# Patient Record
Sex: Female | Born: 1995 | Race: Black or African American | Hispanic: No | Marital: Single | Smoking: Never smoker
Health system: Southern US, Community
[De-identification: ages and names within clinical notes are randomized; demographics above are authoritative.]

## PROBLEM LIST (undated history)

## (undated) DIAGNOSIS — J45909 Unspecified asthma, uncomplicated: Secondary | ICD-10-CM

## (undated) DIAGNOSIS — R51 Headache: Secondary | ICD-10-CM

## (undated) DIAGNOSIS — Z8719 Personal history of other diseases of the digestive system: Secondary | ICD-10-CM

## (undated) HISTORY — DX: Headache: R51

## (undated) HISTORY — PX: OTHER SURGICAL HISTORY: SHX169

## (undated) HISTORY — PX: COLONOSCOPY: SHX174

## (undated) HISTORY — DX: Unspecified asthma, uncomplicated: J45.909

---
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
## 2015-02-18 ENCOUNTER — Encounter: Payer: Self-pay | Admitting: Family

## 2015-03-02 ENCOUNTER — Emergency Department (HOSPITAL_COMMUNITY)
Admission: EM | Admit: 2015-03-02 | Discharge: 2015-03-02 | Disposition: A | Discharge status: Home / Self Care | Payer: BLUE CROSS/BLUE SHIELD | Source: Home / Self Care | Attending: Emergency Medicine | Admitting: Emergency Medicine

## 2015-03-02 ENCOUNTER — Encounter (HOSPITAL_COMMUNITY): Payer: Self-pay | Admitting: *Deleted

## 2015-03-02 ENCOUNTER — Telehealth: Payer: Self-pay | Admitting: *Deleted

## 2015-03-02 DIAGNOSIS — G43001 Migraine without aura, not intractable, with status migrainosus: Secondary | ICD-10-CM

## 2015-03-02 DIAGNOSIS — J0100 Acute maxillary sinusitis, unspecified: Secondary | ICD-10-CM | POA: Diagnosis not present

## 2015-03-02 MED ORDER — AMOXICILLIN 500 MG PO CAPS: 500.0000 mg | ORAL_CAPSULE | Freq: Two times a day (BID) | ORAL | Status: DC

## 2015-03-02 NOTE — ED Provider Notes (Signed)
CSN: [id number]     Arrival date & time 03/02/15  1643 History   First MD Initiated Contact with Patient 03/02/15 1744     Chief Complaint  Patient presents with  . Headache   (Consider location/radiation/quality/duration/timing/severity/associated sxs/prior Treatment) HPI Comments: Kenniya is a 20 yo who is followed by Neurology for Migraine and tension headaches. She presents today with acute onset of "migraine" she thinks brought on by a sinus infection. Patient describes nasal congestion and facial pain x 5 days. No fevers but "feels chilled". No associated sore throat or cough. Her headaches feels like a typical migraine with photophobia and along the lateral regions of her head. See ROS.   Patient is a 20 y.o. female presenting with headaches. The history is provided by the patient.  Headache Associated symptoms: congestion and sinus pressure   Associated symptoms: no cough, no dizziness, no ear pain, no fatigue, no fever, no numbness, no sore throat and no weakness     Past Medical History  Diagnosis Date  . [id number](478.2)    Past Surgical History  Procedure Laterality Date  . Tympanostomy tube placement  1999  . Adenoids   2004    Removed  . Ear tubes removed  2002 and 2009   History reviewed. No pertinent family history. Social History  Substance Use Topics  . Smoking status: Never Smoker   . Smokeless tobacco: Never Used  . Alcohol Use: No   OB History    No data available     Review of Systems  Constitutional: Positive for chills. Negative for fever and fatigue.  HENT: Positive for congestion and sinus pressure. Negative for ear pain, sore throat and tinnitus.   Eyes: Negative.   Respiratory: Negative for cough.   Musculoskeletal: Negative.   Neurological: Positive for headaches. Negative for dizziness, weakness and numbness.  Psychiatric/Behavioral: Negative.     Allergies  Zithromax  Home Medications   Prior to Admission medications   Medication Sig  Start Date End Date Taking? Authorizing Provider  amoxicillin (AMOXIL) 500 MG capsule Take 1 capsule (500 mg total) by mouth 2 (two) times daily. 03/02/15   Riki Sheer, PA-C  atenolol (TENORMIN) 25 MG tablet Take 1/2 tablet at bedtime 11/10/14   Elveria Rising, NP  CAMILA 0.35 MG tablet Take 1 tablet by mouth daily. 01/12/14   Historical Provider, MD  ibuprofen (ADVIL,MOTRIN) 200 MG tablet Take 2 tablets at onset of migraine.    Historical Provider, MD  ondansetron (ZOFRAN) 4 MG tablet Take 1 tablet at onset of nausea. May repeat in 6-8 hours if needed 11/10/14   Elveria Rising, NP  tiZANidine (ZANAFLEX) 4 MG tablet Take 1 tablet at onset of severe headache. May repeat in 6-8 hours if needed 11/10/14   Elveria Rising, NP   Meds Ordered and Administered this Visit  Medications - No data to display  BP 119/79 mmHg  Pulse 88  Temp(Src) 98.4 F (36.9 C) (Oral)  Resp 16  SpO2 100% No data found.   Physical Exam  Constitutional: She is oriented to person, place, and time. She appears well-developed and well-nourished. No distress.  Patient sitting in a lighted exam room, in no acute distress. She is smiling and appears alert  HENT:  Head: Normocephalic and atraumatic.  Right Ear: External ear normal.  Left Ear: External ear normal.  Mouth/Throat: No oropharyngeal exudate.  Swelling to inferior turbinates with erythema and yellow drainage. Pressure to palpation of the maxillary sinus  Eyes: EOM are normal.  Pupils are equal, round, and reactive to light. Right eye exhibits no discharge. Left eye exhibits no discharge.  Neck: Normal range of motion.  Pulmonary/Chest: Effort normal.  Lymphadenopathy:    She has no cervical adenopathy.  Neurological: She is alert and oriented to person, place, and time. No cranial nerve deficit. She exhibits normal muscle tone. Coordination normal.  Skin: Skin is warm and dry. No rash noted. She is not diaphoretic.  Psychiatric: Her behavior is  normal.  Nursing note and vitals reviewed.   ED Course  Procedures (including critical care time)  Labs Review Labs Reviewed - No data to display  Imaging Review No results found.   Visual Acuity Review  Right Eye Distance:   Left Eye Distance:   Bilateral Distance:    Right Eye Near:   Left Eye Near:    Bilateral Near:         MDM   1. Migraine without aura and with status migrainosus, not intractable   2. Acute maxillary sinusitis, recurrence not specified    1. Patient non-toxic and no acute distress. No neuro deficit noted and history of migraines. Offered treatment protocol of injections, but she would like to use her medications from home. Agree. She is instructed to f/u with Neurologist.  2. Treat with Amox, hydration and OTC sinus meds if needed. May continue with use of Advil 600-800mg  tid. F/U if needed. School note given for today and tomorrow.     Riki Sheer, PA-C 03/02/15 1825

## 2015-03-02 NOTE — Discharge Instructions (Signed)
Migraine Headache A migraine headache is an intense, throbbing pain on one or both sides of your head. A migraine can last for 30 minutes to several hours. CAUSES  The exact cause of a migraine headache is not always known. However, a migraine may be caused when nerves in the brain become irritated and release chemicals that cause inflammation. This causes pain. Certain things may also trigger migraines, such as:  Alcohol.  Smoking.  Stress.  Menstruation.  Aged cheeses.  Foods or drinks that contain nitrates, glutamate, aspartame, or tyramine.  Lack of sleep.  Chocolate.  Caffeine.  Hunger.  Physical exertion.  Fatigue.  Medicines used to treat chest pain (nitroglycerine), birth control pills, estrogen, and some blood pressure medicines. SIGNS AND SYMPTOMS  Pain on one or both sides of your head.  Pulsating or throbbing pain.  Severe pain that prevents daily activities.  Pain that is aggravated by any physical activity.  Nausea, vomiting, or both.  Dizziness.  Pain with exposure to bright lights, loud noises, or activity.  General sensitivity to bright lights, loud noises, or smells. Before you get a migraine, you may get warning signs that a migraine is coming (aura). An aura may include:  Seeing flashing lights.  Seeing bright spots, halos, or zigzag lines.  Having tunnel vision or blurred vision.  Having feelings of numbness or tingling.  Having trouble talking.  Having muscle weakness. DIAGNOSIS  A migraine headache is often diagnosed based on:  Symptoms.  Physical exam.  A CT scan or MRI of your head. These imaging tests cannot diagnose migraines, but they can help rule out other causes of headaches. TREATMENT Medicines may be given for pain and nausea. Medicines can also be given to help prevent recurrent migraines.  HOME CARE INSTRUCTIONS  Only take over-the-counter or prescription medicines for pain or discomfort as directed by your  health care provider. The use of long-term narcotics is not recommended.  Lie down in a dark, quiet room when you have a migraine.  Keep a journal to find out what may trigger your migraine headaches. For example, write down:  What you eat and drink.  How much sleep you get.  Any change to your diet or medicines.  Limit alcohol consumption.  Quit smoking if you smoke.  Get 7-9 hours of sleep, or as recommended by your health care provider.  Limit stress.  Keep lights dim if bright lights bother you and make your migraines worse. SEEK IMMEDIATE MEDICAL CARE IF:   Your migraine becomes severe.  You have a fever.  You have a stiff neck.  You have vision loss.  You have muscular weakness or loss of muscle control.  You start losing your balance or have trouble walking.  You feel faint or pass out.  You have severe symptoms that are different from your first symptoms. MAKE SURE YOU:   Understand these instructions.  Will watch your condition.  Will get help right away if you are not doing well or get worse.   This information is not intended to replace advice given to you by your health care provider. Make sure you discuss any questions you have with your health care provider.   Document Released: 01/23/2005 Document Revised: 02/13/2014 Document Reviewed: 09/30/2012 Elsevier Interactive Patient Education 2016 Elsevier Inc.  Sinusitis, Adult Sinusitis is redness, soreness, and puffiness (inflammation) of the air pockets in the bones of your face (sinuses). The redness, soreness, and puffiness can cause air and mucus to get trapped in  your sinuses. This can allow germs to grow and cause an infection.  HOME CARE   Drink enough fluids to keep your pee (urine) clear or pale yellow.  Use a humidifier in your home.  Run a hot shower to create steam in the bathroom. Sit in the bathroom with the door closed. Breathe in the steam 3-4 times a day.  Put a warm, moist  washcloth on your face 3-4 times a day, or as told by your doctor.  Use salt water sprays (saline sprays) to wet the thick fluid in your nose. This can help the sinuses drain.  Only take medicine as told by your doctor. GET HELP RIGHT AWAY IF:   Your pain gets worse.  You have very bad headaches.  You are sick to your stomach (nauseous).  You throw up (vomit).  You are very sleepy (drowsy) all the time.  Your face is puffy (swollen).  Your vision changes.  You have a stiff neck.  You have trouble breathing. MAKE SURE YOU:   Understand these instructions.  Will watch your condition.  Will get help right away if you are not doing well or get worse.   This information is not intended to replace advice given to you by your health care provider. Make sure you discuss any questions you have with your health care provider.   You appear to have a sinus infection and a Migraine. Will treat sinusitis with Amoxicillin, take all of it. Use your medication as per your Neurologist since you did not want acute treatment medications today. Hope you feel better.    Document Released: 07/12/2007 Document Revised: 02/13/2014 Document Reviewed: 08/29/2011 Elsevier Interactive Patient Education [employer]

## 2015-03-02 NOTE — Telephone Encounter (Signed)
Patient called and states that she is sick with a sinus infection and that is triggering migraines. She states that she would like to talk to Inetta Fermo to see if there is anything that could help with the migraine because it is interfering with school and work. Patient can be reached at [phone]

## 2015-03-02 NOTE — ED Notes (Signed)
Pt  Reports  Symptoms  Of  Headache     With  Photophobia   And     Sinus  Congestion           pt  Reports    A  History    Of migraines  In  Past       Pt  Reports  Symptoms     Not  releived  By otc meds

## 2015-03-02 NOTE — Telephone Encounter (Signed)
I talked with Anna Fitzpatrick. She said that she been sick since Friday with what she believes is a sinus infection because she has severe congestion and facial pain. She has been taking Mucinex without much relief. She said that she has been having migraine headache pain for the last day or so and that Advil and Tizanidine have not helped. I explained that it is common for people to have significant headache with sinus congestion. I told her that there was no medication that I could call in that would likely break the headache, and that she should see her PCP or go to Urgent Care for her illness, as she says that the congestion and pain in her face is worsening instead of improving. She agreed with his plan. TG

## 2015-03-02 NOTE — Telephone Encounter (Signed)
I reviewed your note and agree with this recommendation.

## 2015-03-10 ENCOUNTER — Telehealth: Payer: Self-pay | Admitting: Family

## 2015-03-10 NOTE — Telephone Encounter (Signed)
Anna Fitzpatrick left a message saying that a form was going to be faxed from the Student Disability Services department at [address] [facility] and that she needed that to be completed because of how her migraines were affecting her at school. I left a message for Anna Fitzpatrick and asked her to call back. I received the form from her school, and called her about it. Anna Fitzpatrick said that since she was last seen in October, that she was having frequent migraines and that they were causing problems for her at school. She asked for the form to be completed so that she could get some accommodations at school. I told her that if her migraines were that frequent and severe, that she needed to come in for an appointment. She agreed and accepted an appointment for Friday February 3 at 2:15PM, arrival time 2:00PM. TG

## 2015-03-10 NOTE — Telephone Encounter (Signed)
I reviewed your note and agree with this plan. 

## 2015-03-12 ENCOUNTER — Encounter: Payer: Self-pay | Admitting: Family

## 2015-03-12 ENCOUNTER — Ambulatory Visit (INDEPENDENT_AMBULATORY_CARE_PROVIDER_SITE_OTHER): Payer: BLUE CROSS/BLUE SHIELD | Admitting: Family

## 2015-03-12 VITALS — BP 96/64 | HR 82 | Ht 61.0 in | Wt 132.2 lb

## 2015-03-12 DIAGNOSIS — G43001 Migraine without aura, not intractable, with status migrainosus: Secondary | ICD-10-CM

## 2015-03-12 DIAGNOSIS — G44219 Episodic tension-type headache, not intractable: Secondary | ICD-10-CM

## 2015-03-12 DIAGNOSIS — Z8679 Personal history of other diseases of the circulatory system: Secondary | ICD-10-CM

## 2015-03-12 MED ORDER — DIVALPROEX SODIUM ER 250 MG PO TB24
ORAL_TABLET | ORAL | Status: DC
Start: 2015-03-12 — End: 2015-05-14

## 2015-03-12 NOTE — Progress Notes (Signed)
Patient: Anna Fitzpatrick MRN: [id number] Sex: female DOB: 01-Dec-1995  Provider: Elveria Rising, NP Location of Care: [facility]  Note type: Urgent return visit  History of Present Illness: Referral Source: Cliffton Asters, PA History from: patient and [facility] chart Chief Complaint: Migraine/Headaches  Anna Fitzpatrick is a 20 y.o. girl with history of headaches that began in 2010. 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 She was last seen November 10, 2014. Anna Fitzpatrick took Topiramate for migraine prevention in 2015 but stopped it on her own because she felt that it was not particularly beneficial. She fortunately had improvement in her headaches until September 2016, and when Anna saw her in October, recommended Atenolol for migraine prevention. She stopped it because she felt that it was worsening her asthma. Anna Fitzpatrick said that she continued to have frequent headaches but that it was manageable until about a month or so ago when the headaches became daily. Then she had a sinus infection in January 2017 along with a week long migraine that was ultimately treated in the ER. She has felt better since being on an antibiotic for the sinus infection, and although the severe migraine resolved, says that she continues to have frequent migraines that interfere with her functioning at school.   Anna Fitzpatrick complains of holocephalic pain with intolerance to light almost every day. Some days are also accompanied by nausea and feeling week. She says that Ibuprofen does not give her much relief and she is interested in trying another migraine preventative. Anna Fitzpatrick admits that she has been experiencing some stress from college course work. She pushes herself to maintain a high GPA. Anna Fitzpatrick has missed several days of school due to migraine and has applied for Student Disability Services for accommodations. She said that a professor has challenged her about her headaches, saying that he  is a migraine sufferer and that migraines only last one day.   Anna Fitzpatrick says that she has some headaches that may be tension headaches because the pain is less severe and there are no other symptoms, but she says that these headaches are infrequent.   Anna Fitzpatrick denies skipping meals and says that she is getting more sleep since reducing her work study job hours. She says that she drinks water, lemonade and juices, and rarely drinks soft drinks. She says that she does not drink tea or coffee.   Anna Fitzpatrick has no other health concerns today other than previously mentioned.  Review of Systems: Please see the HPI for neurologic and other pertinent review of systems. Otherwise, the following systems are noncontributory including constitutional, eyes, ears, nose and throat, cardiovascular, respiratory, gastrointestinal, genitourinary, musculoskeletal, skin, endocrine, hematologic/lymph, allergic/immunologic and psychiatric.   Past Medical History  Diagnosis Date  . Headache(784.0)    Hospitalizations: No., Head Injury: No., Nervous System Infections: No., Immunizations up to date: Yes.   Past Medical History Comments: See history  Surgical History Past Surgical History  Procedure Laterality Date  . Tympanostomy tube placement  1999  . Adenoids   2004    Removed  . Ear tubes removed  2002 and 2009    Family History family history is not on file. Family History is otherwise negative for migraines, seizures, cognitive impairment, blindness, deafness, birth defects, chromosomal disorder, autism.  Social History Social History   Social History  . Marital Status: Single    Spouse Name: N/A  . Number of Children: N/A  . Years of Education: N/A   Social History Main  Topics  . Smoking status: Never Smoker   . Smokeless tobacco: Never Used  . Alcohol Use: No  . Drug Use: No  . Sexual Activity: No   Other Topics Concern  . None   Social History Narrative   Anna Fitzpatrick is in college at [employer]. She is doing well in school and is doing a work study program with [employer]. Ciena lives with her mom and also stays in her dorm. Harpreet enjoys reading and working with kids.    Allergies Allergies  Allergen Reactions  . Zithromax [Azithromycin] Hives and Rash    Physical Exam BP 96/64 mmHg  Pulse 82  Ht  (1.549 m)  Wt 132 lb 3.2 oz (59.966 kg)  BMI 24.99 kg/m2 General: well developed, well nourished female, seated on exam table, in no evident distress  Head: normocephalic and atraumatic. Oropharynx benign.  Ears, Nose and Throat: Oropharynx benign.  Neck: supple with no carotid or supraclavicular bruits.  Respiratory: auscultation clear  Cardiovascular: regular rate and rhythm, no murmurs.  Musculoskeletal: no obvious deformities or scoliosis  Skin: no rashes or neurocutaneous lesions   Neurologic Exam  Mental Status: Awake and fully alert. Attention span, concentration, and fund of knowledge appropriate for age. Speech fluent without dysarthria. Able to follow commands and participate in examination.  Cranial Nerves: Fundoscopic exam reveal sharp disc margins. Pupils equal, briskly reactive to light. Extraocular movements full without nystagmus. Visual fields full to confrontation. Hearing intact and symmetric to whisper. Facial sensation intact. Face, tongue, palate move normally and symmetrically. Neck flexion and extension normal.  Motor: Normal bulk and tone. Normal strength in all tested extremity muscles  Sensory: Intact to touch and temperature in all extremities.  Coordination: Rapid movements: finger and toe tapping normal and symmetric bilaterally. Finger-to-nose and heel-to-shin intact bilaterally. Able to balance on either foot. Romberg negative.  Gait and Station: Arises from chair without difficulty. Stance is normal. Gait demonstrates normal stride length and balance. Able to heel, toe, and tandem walk without  difficulty.  Reflexes: diminished and symmetric. Toes downgoing. No clonus.    Impression 1. Migraine without aura, without status migrainosus, not intractable 2. Episodic tension headaches, not intractable 3. Migraine variant - ice pick headache 4. History of hypotension  Recommendations for plan of care The patient's previous [facility] records were reviewed. Albirtha has neither had nor required imaging or lab studies since the last visit. She is a 20 year old young woman with history of migraine and tension headaches. She had experienced an increase in migraines and wants to try a migraine preventative again. She used to take Topiramate but did not feel that it was helpful. She then tried Atenolol but felt that it made her asthma worse. Anna talked with Anna Fitzpatrick as well as her mother by phone about the headaches. Anna explained that it is common for migraines to occur when a person has an illness or infection. Anna talked about the options for migraine prevention and stressed that these medications are ineffective for tension headaches. After discussion, Anna Fitzpatrick elected to try Depakote ER. Anna reviewed the medication with Anna Fitzpatrick and her mother by phone, and stressed that the medication should not be taken by pregnant woman. Anna Fitzpatrick says that she is not sexually active and Anna told her that if this changes, that she must use effective birth control while on Depakote.   Anna Fitzpatrick, which was lower than expected today at 96/64. She has  had hypotension before, and says that when migraines are severe that she sometimes feels as if she may faint. Anna explained the relationship between hydration and her blood Fitzpatrick. Anna stressed to Anna Fitzpatrick that she needs to be drinking at least 60-80 oz of water per day and to limit beverages with caffeine. Anna told her that if she felt weak or faint, that she should sit or lie down, to help prevent her from fainting.   Anna reminded Anna Fitzpatrick to avoid skipping  meals and to get least 8 or 9 oz of water per day as these lifestyle measures can with minimizing headaches. We also talked at some length about stress management as she pushes herself to succeed. Anna will write a letter to her school about the prolonged migraines and supporting her decision to reduce her work study hours this semester.   Anna asked Anna Fitzpatrick to call me in 2 weeks to report on her migraines. will see her back in follow up in 6 weeks or sooner if needed. Anna Fitzpatrick agreed with these plans.   The medication list was reviewed and reconciled.  Anna reviewed changes that were made in the prescribed medications today.  A complete medication list was provided to the patient.   Dr. Sharene Skeans was consulted regarding this patient.   Total time spent with the patient was 40 minutes, of which 50% or more was spent in counseling and coordination of care.

## 2015-03-12 NOTE — Patient Instructions (Signed)
For your migraines, we will try a medication called Divalproex (generic for Depakote).  Depakote (Divalproex) is a seizure medication that also has an FDA approval for prevention of migraine headaches. Potential side effects of this medication include weight gain, tremor or stomach upset, but this does not tend to occur on the extended release version that you will be taking. If you have any other side effect, such as unusual sleepiness, let me know. This medication should not be taken by pregnant women as it may cause facial birth defects in a developing fetus. You must use dependable birth control methods while taking Depakote.   To take the Divalproex - take 1 tablet at bedtime. Call me or send me a MyChart message in 2 weeks to let me know how your headaches have responded to the medication. We may need to increase the dose if the migraines are still frequent.   Divalproex (and other migraine medications) are not effective on tension headaches, only migraines.   Your blood pressure is somewhat lower than expected today at 96/64. Remember that you need to be drinking at least 60-80 oz of water per day to help your blood pressure be in a normal range and to help to reduce headaches. If you drink soft drinks, tea or coffee beverages that contain caffeine, that can cause mild dehydration as caffeine removes fluid from your body and can also cause headaches from a rebound effect. Try to drink more water than beverages with caffeine.   Also remember that you should avoid skipping meals, and should be getting 8 or 9 hours of sleep each night. These things also help with reducing headache frequency.   I will write a letter to your school about your prolonged migraines and about your need to reduce the work-study hours. I will let you know when those are ready.   Consider signing up for MyChart - the electronic medical record portal that allows you to see your medical information and gives [place] a quick, secure  way to communicate. There will be sign up information at the end of this paper.   Please plan on returning for follow up in 6 weeks or sooner if needed. Be sure to contact me in 2 weeks to let me know how you are doing.

## 2015-03-16 ENCOUNTER — Encounter: Payer: Self-pay | Admitting: Family

## 2015-03-17 ENCOUNTER — Encounter: Payer: Self-pay | Admitting: Family

## 2015-03-17 ENCOUNTER — Telehealth: Payer: Self-pay | Admitting: Family

## 2015-03-17 DIAGNOSIS — R404 Transient alteration of awareness: Secondary | ICD-10-CM

## 2015-03-17 NOTE — Telephone Encounter (Signed)
Noted thank you
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
## 2015-03-23 ENCOUNTER — Encounter: Payer: Self-pay | Admitting: Family

## 2015-03-26 ENCOUNTER — Ambulatory Visit (HOSPITAL_COMMUNITY): Payer: BLUE CROSS/BLUE SHIELD

## 2015-04-19 ENCOUNTER — Ambulatory Visit: Payer: BLUE CROSS/BLUE SHIELD | Admitting: Family

## 2015-04-23 ENCOUNTER — Ambulatory Visit: Payer: BLUE CROSS/BLUE SHIELD | Admitting: Family

## 2015-05-12 ENCOUNTER — Encounter: Payer: Self-pay | Admitting: Family

## 2015-05-12 NOTE — Telephone Encounter (Signed)
I talked with [name] by phone. She is having increased frequency of migraines. She says that they tend to occur when she is doing school work or watching TV. She denied skipping meals but said that she frequently has upset stomach and vomits. She could not decide if vomiting happened in conjunction with migraines or if it happened independently. [name] said that her migraines usually start with a stomach ache, then the migraine pain begins. She said that she has been having a migraine almost every day in the last couple of weeks that requires sleep to resolve. Because of the complicated nature of her complaints, I asked [name] to come in for an appointment and she accepted an appointment on Friday morning April 7th. I will consult with Dr Sharene SkeansHickling when [name] is in the office. TG

## 2015-05-14 ENCOUNTER — Ambulatory Visit (INDEPENDENT_AMBULATORY_CARE_PROVIDER_SITE_OTHER): Payer: BLUE CROSS/BLUE SHIELD | Admitting: Family

## 2015-05-14 ENCOUNTER — Encounter: Payer: Self-pay | Admitting: Family

## 2015-05-14 VITALS — BP 88/66 | HR 84 | Ht 62.5 in | Wt 136.4 lb

## 2015-05-14 DIAGNOSIS — G44219 Episodic tension-type headache, not intractable: Secondary | ICD-10-CM | POA: Diagnosis not present

## 2015-05-14 DIAGNOSIS — Z8679 Personal history of other diseases of the circulatory system: Secondary | ICD-10-CM | POA: Diagnosis not present

## 2015-05-14 DIAGNOSIS — G43001 Migraine without aura, not intractable, with status migrainosus: Secondary | ICD-10-CM | POA: Diagnosis not present

## 2015-05-14 DIAGNOSIS — G43809 Other migraine, not intractable, without status migrainosus: Secondary | ICD-10-CM | POA: Diagnosis not present

## 2015-05-14 MED ORDER — DIVALPROEX SODIUM ER 250 MG PO TB24: ORAL_TABLET | ORAL | Status: DC

## 2015-05-14 MED ORDER — ONDANSETRON 4 MG PO TBDP: ORAL_TABLET | ORAL | Status: DC

## 2015-05-14 MED ORDER — ASPIRIN-ACETAMINOPHEN-CAFFEINE 250-250-65 MG PO TABS: ORAL_TABLET | ORAL | Status: DC

## 2015-05-14 NOTE — Patient Instructions (Signed)
Thank you for coming in today. Instructions for you until your next appointment are as follows: 1. We will increase the Divalproex ER 250mg  to 2 tablets at bedtime 2. For headaches during the day, take Excedrin Migraine - 2 tablets at the onset of the headache. You can repeat it in 4-6 hours if needed.  3. For nausea, take Ondansetron 4mg . I have changed it to a "melt in your mouth" tablet, so take that first and in about 10 minutes, take the Excedrin Migraine.  4. Only take Tizanidine 4mg  at nighttime for severe headaches that keep you from being able to sleep.  5. Work on exercising at least 20 minutes every day - walking is a good exercise to do.  6. Remember to avoid skipping meals and to stay on a regular sleep schedule. 7. Keep a headache diary and send me the chart each month. I will call you or send you a MyChart message each time I receive a diary. 8. Your blood pressure is low today at 88/66. You need to be drinking at least 60 oz of water each day. I would also recommend about 20 oz of a sports drink such as sugar free Gatorade each day.  Please plan to return for follow up in 2 months or sooner if needed.

## 2015-05-14 NOTE — Progress Notes (Signed)
Patient: Anna Fitzpatrick MRN: [id number] Sex: female DOB: August 29, 1995  Provider: Elveria Rising, NP Location of Care: [facility]  Note type: Urgent return visit  History of Present Illness: History from: patient and [facility] chart Chief Complaint: migraine headaches  Anna Fitzpatrick is a 20 y.o. young woman with history of headaches that began in 2010. 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 She was last seen March 12, 2015. Anna Fitzpatrick took Topiramate for migraine prevention in 2015 but stopped it on her own because she felt that it was not particularly beneficial and because she was concerned about it making her oral contraceptive less effective. She fortunately had improvement in her headaches until September 2016, and when I saw her in October, recommended Atenolol for migraine prevention. She stopped it because she felt that it was worsening her asthma. Anna Fitzpatrick said that she continued to have frequent headaches but that it was manageable until January 2017 when the headaches became daily. She had a sinus infection in January 2017 along with a week long migraine that was ultimately treated in the ER. When she was seen in the office in March 12, 2015, Divalproex ER  was recommended for migraine prevention.    Anna Fitzpatrick contacted me via MyChart on March 17 2015 to report a painful vibration in her head along with a syncopal episode. A EEG was recommended but the out of pocket cost for Anna Fitzpatrick was going to be large and she decided not to proceed with that.    Anna Fitzpatrick contacted me 2 days ago because she has been experiencing frequent migraine headaches and that she felt that the only relief she had was when she was asleep. She has not been keeping headache diaries but describes near daily headaches of varying severity. She said that she had a daith peircing on March 19, 2069 and felt that the migraines were less frequent and less severe for about  a week afterwards, but then worsened after that. Anna Fitzpatrick reported that watching TV or doing schoolwork tended to worsen headaches. She said that she had an eye examination and no changes were needed in her prescription for contact lenses. Anna Fitzpatrick had some difficulty describing her migraines but said that she generally had pain all over her head with intolerance to light, nausea and feeling "fuzzy" and "not grounded". She said that she sometimes felt lightheaded and dizzy. Anna Fitzpatrick complains of significant nausea and says that she doesn't take Ibuprofen because it makes her feel more nauseated. She has been taking Tizanidine, and says that helps her sleep through the pain but does not give headache relief.   Anna Fitzpatrick denies skipping meals and says that she is getting at least 8 hours of sleep. She says that she drinks water, lemonade and juices, and rarely drinks soft drinks. She says that she does not drink tea or coffee. She says that she has tried to increase her fluid intake after her last visit when she learned that her blood pressure was lower than expected. Anna Fitzpatrick denies school related stress but admits that she pushes herself to do well academically. She has a work-study job in the evenings and says that she has missed time from work due to migraine.   Anna Fitzpatrick admits that she is both worried and frustrated about the migraine headaches. She wonders if there is any other testing that should be done to determine why she has migraines. Anna Fitzpatrick says that she has a migraine this morning and is lying on  the exam table with the lights off in the room. She believes this headache was triggered by poor sleep last night, saying that she awakened during the night with a migraine. Anna Fitzpatrick said that she was unable to eat this morning but drank some apple juice.  Anna Fitzpatrick has no other health concerns today other than previously mentioned.  Review of Systems: Please see the HPI for neurologic and other pertinent review of  systems. Otherwise, the following systems are noncontributory including constitutional, eyes, ears, nose and throat, cardiovascular, respiratory, gastrointestinal, genitourinary, musculoskeletal, skin, endocrine, hematologic/lymph, allergic/immunologic and psychiatric.   Past Medical History  Diagnosis Date  . Headache(784.0)    Hospitalizations: No., Head Injury: Yes.  , Nervous System Infections: No., Immunizations up to date: Yes.   Past Medical History Comments: See history  Surgical History Past Surgical History  Procedure Laterality Date  . Tympanostomy tube placement  1999  . Adenoids   2004    Removed  . Ear tubes removed  2002 and 2009    Family History family history is not on file. Family History is otherwise negative for migraines, seizures, cognitive impairment, blindness, deafness, birth defects, chromosomal disorder, autism.  Social History Social History   Social History  . Marital Status: Single    Spouse Name: N/A  . Number of Children: N/A  . Years of Education: N/A   Social History Main Topics  . Smoking status: Never Smoker   . Smokeless tobacco: Never Used  . Alcohol Use: No  . Drug Use: No  . Sexual Activity: No   Other Topics Concern  . 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 She is doing well in school and is doing a work study program with [employer]. Anna Fitzpatrick lives with her mom and also stays in her dorm. Anna Fitzpatrick enjoys reading and working with kids.    Allergies Allergies  Allergen Reactions  . Zithromax [Azithromycin] Hives and Rash    Physical Exam BP 88/66 mmHg  Pulse 84  Ht 5' 2.5" (1.588 m)  Wt 136 lb 6.4 oz (61.871 kg)  BMI 24.54 kg/m2  LMP 05/06/2015 (Exact Date) General: well developed, well nourished female, lying on exam table, shielding her eyes from light.  Head: normocephalic and atraumatic. Oropharynx benign.  Ears, Nose and Throat: Oropharynx benign.  Neck:  supple with no carotid or supraclavicular bruits.  Respiratory: auscultation clear  Cardiovascular: regular rate and rhythm, no murmurs.  Musculoskeletal: no obvious deformities or scoliosis  Skin: no rashes or neurocutaneous lesions   Neurologic Exam  Mental Status: Awake and fully alert. Attention span, concentration, and fund of knowledge appropriate for age. Speech fluent without dysarthria. Able to follow commands and participate in examination.  Cranial Nerves: Fundoscopic exam reveal sharp disc margins. Pupils equal, briskly reactive to light. Extraocular movements full without nystagmus. Visual fields full to confrontation. Hearing intact and symmetric to whisper. Facial sensation intact. Face, tongue, palate move normally and symmetrically. Neck flexion and extension normal.  Motor: Normal bulk and tone. Normal strength in all tested extremity muscles  Sensory: Intact to touch and temperature in all extremities.  Coordination: Rapid movements: finger and toe tapping normal and symmetric bilaterally. Finger-to-nose and heel-to-shin intact bilaterally. Able to balance on either foot. Romberg negative.  Gait and Station: Arises from chair without difficulty. Stance is normal. Gait demonstrates normal stride length and balance. Able to heel, toe, and tandem walk without difficulty.  Reflexes: diminished and symmetric. Toes  downgoing. No clonus.   Impression 1. Migraine without aura, without status migrainosus, not intractable 2. Episodic tension headaches, not intractable 3. Migraine variant - ice pick headache 4. History of hypotension  Recommendations for plan of care [facility]. [name] has neither had nor required imaging or lab studies since the last visit. She is a 20 year old young woman with history of migraine and tension headaches. She is taking Divalproex ER 250mg  for migraine prevention and has not experienced reduction of  migraine frequency or severity since her last visit in February. In addition, she feels that Ibuprofen makes her more nauseated and does not give relief of pain. She has tolerated Tizandine, and while it has helped her to sleep when she is in pain, it has not aborted the migraine. I talked with [name] about migraines and explained that she needed to keep a headache diary so that we can determine how many migraines and how many tension headaches she is having. I reminded her that migraine preventative medications such as Divalproex will not treat tension headaches. Since she describes most of her headaches as migraines with nausea and light intolerance, I recommended that she increase the Divalproex ER 250mg  to 2 tablets at bedtime for now. I also recommended that she try Excedrin Migraine to treat her headaches since she feels that Ibuprofen is ineffective. I changed the Ondansetron prescription to an ODT formulation because of her complaints of nausea, and instructed her to take the Ondansetron ODT first, then take the Excedrin Migraine about 10 minutes later.   I also talked with [name] about her blood pressure, which was lower than expected today at 88/66. She has had hypotension before, and says that when migraines are severe that she sometimes feels as if she may faint. I explained the relationship between hydration and her blood pressure. I stressed to [name] that she needs to be drinking at least 60-80 oz of water per day and to limit beverages with caffeine. I told her that if she felt weak or faint, that she should sit or lie down, to help prevent her from fainting.   I reminded [name] to avoid skipping meals and to get least 8 or 9 hours of sleep per day as these lifestyle measures can with minimizing headaches.  [name] says that she naps frequently during the day because of headache pain, and I encouraged her to try to avoid doing so unless the headache was intolerable. I explained to her that  excessive sleep can also trigger headaches and that it is best to stay on a regular sleep schedule. I also recommended that she begin a daily exercise program such as walking, and explained that exercise has been shown to reduce headache frequency.   Finally, I attempted to reassure [name] about her headaches, explaining that she has a normal neurological examination and a history of headaches since 2010. I told her that an MRI of the brain could be performed, but that it was likely to be normal, because of her history of headaches with a normal examination. She decided to wait on that for now.  I asked [name] to send in the April headache diary at the end of the month, to contact me by phone or MyChart if she has any questions or concerns and to return for follow up in 2 months or sooner if needed. Willetta agreed with these plans.   The medication list was reviewed and reconciled.  I reviewed changes that were  made in the prescribed medications today.  A complete medication list was provided to the patient.    Medication List       This list is accurate as of: 05/14/15 10:48 AM.  Always use your most recent med list.               aspirin-acetaminophen-caffeine 250-250-65 MG tablet  Commonly known as:  EXCEDRIN MIGRAINE  Take 2 tablets at onset of migraine. May repeat in 4-6 hours if needed.     CAMILA 0.35 MG tablet  Generic drug:  norethindrone  Take 1 tablet by mouth daily. Reported on 03/12/2015     divalproex 250 MG 24 hr tablet  Commonly known as:  DEPAKOTE ER  Take 2 tablets at bedtime daily     ibuprofen 200 MG tablet  Commonly known as:  ADVIL,MOTRIN  Take 2 tablets at onset of migraine.     ondansetron 4 MG disintegrating tablet  Commonly known as:  ZOFRAN ODT  Take 1 under the tongue at onset of nausea. May repeat in 4-6 hours if needed.     tiZANidine 4 MG tablet  Commonly known as:  ZANAFLEX  Take 1 tablet at onset of severe headache. May repeat in 6-8 hours if  needed       Dr. Sharene Skeans was consulted regarding the patient.   Total time spent with the patient was 40 minutes, of which 50% or more was spent in counseling and coordination of care.   Elveria Rising

## 2015-06-01 ENCOUNTER — Encounter: Payer: Self-pay | Admitting: Family

## 2015-06-01 NOTE — Telephone Encounter (Signed)
I reviewed your note and agree with your recommendations and plan.

## 2015-06-01 NOTE — Telephone Encounter (Signed)
I called Anna Fitzpatrick as she requested. She said that she developed a migraine on the weekend and that the pain was severe. She said that at one point she felt vibration in her head from the pain. She took Tizanidine and Excedrin Migraine and was able to sleep but the migraine persists when she awakens. She has some nausea and vomited once. She has no appetite and has had difficulty drinking some liquids - said that water made her vomit once. She is able to drink small amounts of tea or soft drinks periodically. She has not missed classes as this is exam week and her exams are scheduled later in the week. I talked with Anna Fitzpatrick about the migraine and explained that there is nothing that I can call in to a local pharmacy at this point to break the migraine. I told her that she will need to be seen in the ER for treatment. I encouraged her to continue to drink small amounts of liquids and to nibble on crackers as she can tolerate it so that her stomach will be less empty when she takes medication. We talked about exams this week and I sent her a note to request an extension if the migraine persists. She has an appointment with me on June 2nd, but I told her that I would be happy to see her sooner if she wanted to do so. She said that she would let me know after she finishes this semester. Anna Fitzpatrick agreed with the plans made today regarding her current migraine. TG

## 2015-07-14 ENCOUNTER — Ambulatory Visit (INDEPENDENT_AMBULATORY_CARE_PROVIDER_SITE_OTHER): Payer: BLUE CROSS/BLUE SHIELD | Admitting: Family

## 2015-07-14 ENCOUNTER — Encounter: Payer: Self-pay | Admitting: Family

## 2015-07-14 VITALS — BP 90/64 | HR 82 | Ht 62.25 in | Wt 141.4 lb

## 2015-07-14 DIAGNOSIS — G44219 Episodic tension-type headache, not intractable: Secondary | ICD-10-CM | POA: Diagnosis not present

## 2015-07-14 DIAGNOSIS — G43001 Migraine without aura, not intractable, with status migrainosus: Secondary | ICD-10-CM | POA: Diagnosis not present

## 2015-07-14 DIAGNOSIS — G43809 Other migraine, not intractable, without status migrainosus: Secondary | ICD-10-CM

## 2015-07-14 DIAGNOSIS — Z8679 Personal history of other diseases of the circulatory system: Secondary | ICD-10-CM | POA: Diagnosis not present

## 2015-07-14 NOTE — Progress Notes (Signed)
Patient: Anna Fitzpatrick MRN: [id number] Sex: female DOB: [birth year]  Provider: Elveria Risingina Yitzhak Awan, NP [facility]  Note type: Routine return visit  History of Present Illness: Referral Source: Dr. Blair Heysobert Ehinger History from: patient, referring [facility] Chief Complaint: Migraines  Anna Fitzpatrick is a 20 y.o. young woman with history of headaches that began in 2010. 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 She was last seen May 14, 2015. Anna Fitzpatrick took Topiramate for migraine prevention in 2015 but stopped it on her own because she felt that it was not particularly beneficial and because she was concerned about it making her oral contraceptive less effective. She fortunately had improvement in her headaches until September 2016, and when I saw her in October, recommended Atenolol for migraine prevention. She stopped it because she felt that it was worsening her asthma. Anna Fitzpatrick said that she continued to have frequent headaches but that it was manageable until January 2017 when the headaches became daily. She had a sinus infection in January 2017 along with a week long migraine that was ultimately treated in the ER. When she was seen in the office in March 12, 2015, Divalproex ER 250mg  was recommended for migraine prevention. Anna Fitzpatrick reports today that while she has had some improvement in migraines, she continues to experience them and is frustrated because she is unable to function in school when the migraine is present. Unfortunately sometimes Anna Fitzpatrick migraines last for several days, and that causes her to get behind in her school work.   Anna Fitzpatrick reports that working at [employer] computer, reading and watching TV or videos can trigger migraines. She asked for a letter for her school to explain her condition and request an accommodation of extended time for assignments and tests when a migraine is present.   Anna Fitzpatrick contacted  me via MyChart on March 17 2015 to report a painful vibration in her head along with a syncopal episode. A EEG was recommended but the out of pocket cost for Anna Fitzpatrick was going to be large and she decided not to proceed with that.   Anna Fitzpatrick complains of significant nausea with her migraine and says that she doesn't take Ibuprofen because it makes her feel more nauseated. She has been taking Tizanidine, and says that helps her sleep through the pain but does not give headache relief.   Anna Fitzpatrick denies skipping meals and says that she is getting at least 8 hours of sleep. She says that she drinks water, lemonade and juices, and rarely drinks soft drinks. She says that she does not drink tea or coffee. Anna Fitzpatrick denies school related stress but admits that she pushes herself to do well academically. She says that she made straight A's for the past semester. She has a work-study job in the evenings and says that she has missed time from work due to migraine.   Anna Fitzpatrick has no other health concerns today other than previously mentioned.  Review of Systems: Please see the HPI for neurologic and other pertinent review of systems. Otherwise, the following systems are noncontributory including constitutional, eyes, ears, nose and throat, cardiovascular, respiratory, gastrointestinal, genitourinary, musculoskeletal, skin, endocrine, hematologic/lymph, allergic/immunologic and psychiatric.   Past Medical History  Diagnosis Date  . Headache(784.0)    Hospitalizations: No., Head Injury: No., Nervous System Infections: No., Immunizations up to date: Yes.   Past Medical History Comments: See history  Surgical History Past Surgical History  Procedure Laterality Date  . Tympanostomy  tube placement  1999  . Adenoids   2004    Removed  . Ear tubes removed  2002 and 2009    Family History family history is not on file. Family History is otherwise negative for migraines, seizures, cognitive impairment,  blindness, deafness, birth defects, chromosomal disorder, autism.  Social History Social History   Social History  . Marital Status: Single    Spouse Name: N/A  . Number of Children: N/A  . Years of Education: N/A   Social History Main Topics  . Smoking status: Never Smoker   . Smokeless tobacco: Never Used  . Alcohol Use: No  . Drug Use: No  . Sexual Activity: No   Other Topics Concern  . 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 She is doing well in school and is doing a work study program with [employer]. Anna Fitzpatrick lives with her mom and also stays in her dorm. Anna Fitzpatrick enjoys reading and working with kids.    Allergies Allergies  Allergen Reactions  . Zithromax [Azithromycin] Hives and Rash    Physical Exam BP 90/64 mmHg  Pulse 82  Ht 5' 2.25" (1.581 m)  Wt 141 lb 6.4 oz (64.139 kg)  BMI 25.66 kg/m2  LMP 07/05/2015 (Within Weeks) General: well developed, well nourished female, sitting on exam table Head: normocephalic and atraumatic. Oropharynx benign.  Ears, Nose and Throat: Oropharynx benign.  Neck: supple with no carotid or supraclavicular bruits.  Respiratory: auscultation clear  Cardiovascular: regular rate and rhythm, no murmurs.  Musculoskeletal: no obvious deformities or scoliosis  Skin: no rashes or neurocutaneous lesions   Neurologic Exam  Mental Status: Awake and fully alert. Attention span, concentration, and fund of knowledge appropriate for age. Speech fluent without dysarthria. Able to follow commands and participate in examination.  Cranial Nerves: Fundoscopic exam reveal sharp disc margins. Pupils equal, briskly reactive to light. Extraocular movements full without nystagmus. Visual fields full to confrontation. Hearing intact and symmetric to whisper. Facial sensation intact. Face, tongue, palate move normally and symmetrically. Neck flexion and extension normal.  Motor: Normal bulk and  tone. Normal strength in all tested extremity muscles  Sensory: Intact to touch and temperature in all extremities.  Coordination: Rapid movements: finger and toe tapping normal and symmetric bilaterally. Finger-to-nose and heel-to-shin intact bilaterally. Able to balance on either foot. Romberg negative.  Gait and Station: Arises from chair without difficulty. Stance is normal. Gait demonstrates normal stride length and balance. Able to heel, toe, and tandem walk without difficulty.  Reflexes: diminished and symmetric. Toes downgoing. No clonus.   Impression 1.  Migraine without aura, without status migrainosus, not intractable 2. Episodic tension headaches, not intractable 3. Migraine variant - ice pick headache 4. History of hypotension  Recommendations for plan of care The patient's previous [facility] records were reviewed. Anna Fitzpatrick has neither had nor required imaging or lab studies since the last visit. She is a 20 year old young woman with history of migraine and tension headaches. She is taking Divalproex ER 250mg  for migraine prevention and has not experienced significant reduction of migraine frequency. She is having some difficulty at school because of migraines and needs a letter to explain her condition and request accommodations. Anna Fitzpatrick will let me know when she wants the letter as she says that she needs to send it in closer to fall semester. I will write the letter when she notifies me and send it to her. I reminded Anna Fitzpatrick  to avoid skipping meals, to get enough sleep and to be very well hydrated. We talked about her trying computer glasses with a special tint to reduce blue light glare to see if that helps with her migraines. I will see her back in October or sooner if needed.   The medication list was reviewed and reconciled.  No changes were made in the prescribed medications today.  A complete medication list was provided to the patient.     Medication List       This list is  accurate as of: 07/14/15  3:14 PM.  Always use your most recent med list.               aspirin-acetaminophen-caffeine 250-250-65 MG tablet  Commonly known as:  EXCEDRIN MIGRAINE  Take 2 tablets at onset of migraine. May repeat in 4-6 hours if needed.     divalproex 250 MG 24 hr tablet  Commonly known as:  DEPAKOTE ER  Take 2 tablets at bedtime daily     etonogestrel 68 MG Impl implant  Commonly known as:  NEXPLANON  1 each by Subdermal route once. Placed in left arm 2016     ondansetron 4 MG disintegrating tablet  Commonly known as:  ZOFRAN ODT  Take 1 under the tongue at onset of nausea. May repeat in 4-6 hours if needed.     tiZANidine 4 MG tablet  Commonly known as:  ZANAFLEX  Take 1 tablet at onset of severe headache. May repeat in 6-8 hours if needed        Dr. Sharene Skeans was consulted regarding the patient.   Total time spent with the patient was 30 minutes, of which 50% or more was spent in counseling and coordination of care.   Elveria Rising

## 2015-07-15 NOTE — Patient Instructions (Signed)
I will write a letter for your school as we discussed. I will explain about your condition and ask for extended time when a migraine occurs.   Continue to take your medication as prescribed. Remember to avoid headache triggers such as skipping meals, not drinking enough water, and not getting enough sleep.   Please plan to return for follow up in October or sooner if needed.

## 2015-08-02 ENCOUNTER — Encounter: Payer: Self-pay | Admitting: Family

## 2015-08-02 NOTE — Telephone Encounter (Signed)
I called Anna Fitzpatrick and talked with her about the migraine. She said that she was experiencing head pain and intolerance to light. I explained to her that there was nothing else that I can call in to a pharmacy for her, and that she should hydrate herself well, rest and if the migraine continues that she can go to a local Urgent care or ER. Anna Fitzpatrick agreed with these instructions. TG

## 2015-09-29 ENCOUNTER — Encounter: Payer: Self-pay | Admitting: Family

## 2015-09-29 NOTE — Telephone Encounter (Signed)
We can try low-dose clonidine to see if we can regularize her bedtime.  As you know this does not often produce sleep throughout the entire night.

## 2015-09-29 NOTE — Telephone Encounter (Signed)
I called Anna Fitzpatrick as she requested. She said that she has had problems with waking as soon as it is daylight and waking several times during the night since she has had migraines with light sensitivity. She denies any pain or any other problems waking her up and says that she simply awakens during the night and cannot return to sleep. She says that she has been wearing a sleep mask to keep the light out of her eyes but that she still awakens. She says that she sometimes has trouble going to sleep even if she is tired. Anna Fitzpatrick says that she has tried Melatonin without good results. She wants to know if there is a medication to help her to go to sleep and sleep all night. I talked with Anna Fitzpatrick and attempted to explain sleep hygiene. She said that she knew about that and that it did not work for her. She said that she does not go to bed at consistent times each night because it does not work to help her to go to sleep. She said that sometimes she is in bed at 9PM and sometimes it may be 1AM. I attempted to explain again about sleep hygiene and she insisted that it does not work. I explained to Anna Fitzpatrick that "sleeping pills" are inappropriate for her and that I will not prescribe them. I suggested that she try sleep hygiene and Melatonin consistently as I had explained to her. She had no further questions. TG

## 2015-10-07 ENCOUNTER — Encounter: Payer: Self-pay | Admitting: Family

## 2015-10-08 ENCOUNTER — Encounter: Payer: Self-pay | Admitting: Pediatrics

## 2015-10-12 ENCOUNTER — Telehealth: Payer: Self-pay

## 2015-10-12 DIAGNOSIS — G4700 Insomnia, unspecified: Secondary | ICD-10-CM

## 2015-10-12 MED ORDER — CLONIDINE HCL 0.1 MG PO TABS: ORAL_TABLET | ORAL | 1 refills | Status: DC

## 2015-10-12 NOTE — Telephone Encounter (Signed)
I understand what you are being told by the disability services.  My Chart to be pretty valuable.  We should probably create a brief note so that you don't spend a lot of time doing this.

## 2015-10-12 NOTE — Telephone Encounter (Signed)
Patient called wanting to make us aware about her school letter. She states that she has one teacher that will not accommodate her in any way. She states that the teacher does not believe that she has any disabilities. She also states that the teacher wants to know ahead of time when she is going to have a headache. Patient states that she missed one class last week and the teacher did not approve her extension. She is asking that a call be made to the Students w/ Disabilities line #[phone]. She states that the teacher's name is Jesse Sanselicia Stewart.  Patient also wants to know if her having sleep issues is causing her to have these headaches.  [phone]

## 2015-10-12 NOTE — Telephone Encounter (Signed)
I called the Student Disability services office as requested. I was told that in order for Anna Fitzpatrick to receive accommodations, a note must be sent in for each migraine event to that office at fax [phone]. I called Anna Fitzpatrick and told her what the Disability office told me. She agreed to call or send MyChart message each time she has a migraine. She will send me the dates from last week and I will send a note in for her.   We also talked about the insomnia and I told her that I would send in a prescription for Clonidine to be take prior to bedtime as Dr Sharene SkeansHickling and I had discussed. I explained that we would try it short term and that she still needs to practice good sleep hygiene. [name] agreed with these plans. TG

## 2015-10-13 ENCOUNTER — Encounter: Payer: Self-pay | Admitting: Family

## 2015-10-13 NOTE — Telephone Encounter (Signed)
I faxed a letter to the student disability services office as requested. TG

## 2015-10-17 ENCOUNTER — Encounter: Payer: Self-pay | Admitting: Family

## 2015-10-24 ENCOUNTER — Encounter: Payer: Self-pay | Admitting: Family

## 2015-10-25 ENCOUNTER — Other Ambulatory Visit: Payer: Self-pay | Admitting: Family

## 2015-10-25 DIAGNOSIS — G43001 Migraine without aura, not intractable, with status migrainosus: Secondary | ICD-10-CM

## 2015-10-25 MED ORDER — ONDANSETRON 4 MG PO TBDP: ORAL_TABLET | ORAL | 3 refills | Status: DC

## 2015-10-25 NOTE — Telephone Encounter (Signed)
I called and talked to Anna Fitzpatrick. She has had a migraine since yesterday with pain, photophobia and nausea. She is slightly less nauseated today but continues to have pain and photophobia. She does not want to go to the ER for treatment. [name] is aware that there is not a medication that she can take orally to break this migraine. She asked for a school note for today, and I faxed a note to her school as requested. TG

## 2015-11-08 ENCOUNTER — Encounter: Payer: Self-pay | Admitting: Family

## 2015-11-09 ENCOUNTER — Encounter: Payer: Self-pay | Admitting: Family

## 2015-11-09 NOTE — Telephone Encounter (Signed)
I called Anna Fitzpatrick and talked with her about her concerns. She said that she developed a severe sinus infection this weekend and then on Sunday afternoon developed a migraine with severe pain, nausea and abdominal pain along with the sinus infection symptoms. She said that with the nausea she occasionally had hot flashes and felt sweaty. Her other concern was that she had trouble remembering things that her mother said to her Sunday evening and yesterday. Finally, Anna Fitzpatrick said that she has online work to do for school and is unable to tolerate looking at the computer screen because it makes the migraine pain worsen. She said that she feels that the sinus infection is getting slightly better and hopes that the migraine will get better as the sinus infection improves but is worried about school assignments that are due. I talked with Anna Fitzpatrick about her symptoms and explained that her hot flashes and feeling sweaty are likely due to being sick and being dehydrated. I attempted to reassure her that having difficulty remembering things was also from being sick and from taking medications for her illness. I told her that I will send a note to her school asking for an extension for her assignments due this week. Tenasia agreed with this plan. TG

## 2015-11-25 ENCOUNTER — Encounter: Payer: Self-pay | Admitting: Family

## 2015-12-20 ENCOUNTER — Encounter: Payer: Self-pay | Admitting: Family

## 2016-01-03 ENCOUNTER — Encounter: Payer: Self-pay | Admitting: Family

## 2016-01-03 ENCOUNTER — Other Ambulatory Visit (INDEPENDENT_AMBULATORY_CARE_PROVIDER_SITE_OTHER): Payer: Self-pay | Admitting: Family

## 2016-01-03 DIAGNOSIS — G43009 Migraine without aura, not intractable, without status migrainosus: Secondary | ICD-10-CM

## 2016-01-03 MED ORDER — TIZANIDINE HCL 4 MG PO TABS: ORAL_TABLET | ORAL | 1 refills | Status: DC

## 2016-01-03 NOTE — Telephone Encounter (Signed)
I called Anna Fitzpatrick back as she requested today and she said that yesterday she developed a headache that felt like vibrations that started in her head and went down her body. She also felt intermittent "shocks of pain". She says that when she has a headache that she typically feels that she has a fever and that yesterday her temperature was 101.5 orally. She took Advil migraine at that time. She said that she had poor appetite but has tried to drink liquids. She has had the headache since yesterday and tried alternating with Excedrin migraine but doesn't feel that it helps as much. She is in college and has 2 tests today, but then has a few days off before exams start on Friday. I encouraged Trecia to drink fluids liberally, and to rest in bed when she can do so. I told her that she may have a viral illness in addition to her migraine. I asked her to schedule a follow up visit in December when she is out of school. She asked for a refill on Tizanidine which I sent in. TG

## 2016-01-04 ENCOUNTER — Encounter (INDEPENDENT_AMBULATORY_CARE_PROVIDER_SITE_OTHER): Payer: Self-pay | Admitting: Family

## 2016-01-10 ENCOUNTER — Encounter: Payer: Self-pay | Admitting: Family

## 2016-01-13 ENCOUNTER — Ambulatory Visit (INDEPENDENT_AMBULATORY_CARE_PROVIDER_SITE_OTHER): Payer: BLUE CROSS/BLUE SHIELD | Admitting: Family

## 2016-01-19 ENCOUNTER — Ambulatory Visit (INDEPENDENT_AMBULATORY_CARE_PROVIDER_SITE_OTHER): Payer: BLUE CROSS/BLUE SHIELD | Admitting: Family

## 2016-01-19 ENCOUNTER — Encounter (INDEPENDENT_AMBULATORY_CARE_PROVIDER_SITE_OTHER): Payer: Self-pay | Admitting: Family

## 2016-01-19 VITALS — BP 90/60 | HR 80 | Ht 61.25 in | Wt 152.6 lb

## 2016-01-19 DIAGNOSIS — G43001 Migraine without aura, not intractable, with status migrainosus: Secondary | ICD-10-CM | POA: Diagnosis not present

## 2016-01-19 DIAGNOSIS — G4700 Insomnia, unspecified: Secondary | ICD-10-CM

## 2016-01-19 DIAGNOSIS — G43809 Other migraine, not intractable, without status migrainosus: Secondary | ICD-10-CM

## 2016-01-19 DIAGNOSIS — Z8679 Personal history of other diseases of the circulatory system: Secondary | ICD-10-CM | POA: Diagnosis not present

## 2016-01-19 DIAGNOSIS — G44219 Episodic tension-type headache, not intractable: Secondary | ICD-10-CM

## 2016-01-19 DIAGNOSIS — R55 Syncope and collapse: Secondary | ICD-10-CM

## 2016-01-19 MED ORDER — QUDEXY XR 25 MG PO CS24: EXTENDED_RELEASE_CAPSULE | ORAL | 0 refills | Status: DC

## 2016-01-19 MED ORDER — CLONIDINE HCL 0.1 MG PO TABS: ORAL_TABLET | ORAL | 1 refills | Status: DC

## 2016-01-19 NOTE — Progress Notes (Signed)
Patient: Anna Fitzpatrick MRN: [id number] Sex: female DOB: August 30, 1995  Provider: Elveria Rising, NP Location of Care: [facility]  Note type: Routine return visit  History of Present Illness: Referral Source:  Anna Heys, MD History from: patient and [facility] chart Chief Complaint: Migraines  [name] is a 20 y.o. young woman with history of headaches that began in 2010. 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 She was last seen July 14, 2015. [name] took Topiramate for migraine prevention in 2015 but stopped it on her own because she felt that it was not particularly beneficial and because she was concerned about it making her oral contraceptive less effective. She fortunately had improvement in her headaches until September 2016, and when I saw her in October, recommended Atenolol for migraine prevention. She stopped it because she felt that it was worsening her asthma. Anna Fitzpatrick said that she continued to have frequent headaches but that it was manageable until January 2017 when the headaches became daily. She had a sinus infection in January 2017 along with a week long migraine that was ultimately treated in the ER. When she was seen in the office in March 12, 2015, Divalproex ER 250mg  was recommended for migraine prevention. In talking with [name] today, I learned that she has not been taking this medication.  When [name] was last seen, she was continuing to experience fairly frequent migraines, and sometimes the migraines lasted several days. That resulted in her being unable to function in school and thus getting behind in her college course work. I wrote a letter to her school explaining her condition, and [name] agreed to contact me each time she had a migraine. For fall semester, I sent a letter to her school requesting an extension of time to complete her work. [name] was faithful to contact me for each migraine and this system  worked out well for her. Unfortunately, [name] continued to have frequent and severe migraine headaches.   [name] reports that working at [employer], reading and watching TV or videos can trigger migraines. She also has some difficulty sleeping, and I gave her a course of Clonidine along with instructions on sleep hygiene to try to get her sleep schedule back on track.   [name] complains of severe pain with her migraines, along with what she describes as a painful vibration in her head that spreads down through her body, including her extremities. This vibration only occurs when she has a migraine. She also can have syncope with her migraines, and says that she tends to be so nauseated with the migraines that she is unable to eat or drink. [name] has a low blood pressure when she is feeling well, and I suspect that she easily becomes dehydrated when a migraine occurs.  She also usually has light and sound intolerance.  [name] says that she usually takes Excedrin Migraine when she has a migraine if she can take it soon enough in the migraine process. If she waits to take it, she becomes too nauseated and cannot tolerate it. She has tried Tizanidine and says that it makes her sleep but does not give her pain relief.   [name] denies skipping meals and says that she tries to get 8 hours of sleep but frequently awakens at night. She says that she drinks water, lemonade and juices, and rarely drinks soft drinks. She says that she does not drink tea or coffee. [name] denies school related stress but admits that  she pushes herself to do well academically. 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   [name] is lying down on the exam table today and says that she developed a migraine about 30 minutes prior to the office visit. She complains of light intolerance today but says that she is not yet nauseated. She has taken Excedrin Migraine but says that  it has not yet given relief. [name] has no other health concerns today other than previously mentioned.  Review of Systems: Please see the HPI for neurologic and other pertinent review of systems. Otherwise, the following systems are noncontributory including constitutional, eyes, ears, nose and throat, cardiovascular, respiratory, gastrointestinal, genitourinary, musculoskeletal, skin, endocrine, hematologic/lymph, allergic/immunologic and psychiatric.   Past Medical History:  Diagnosis Date  . Headache(784.0)    Hospitalizations: No., Head Injury: No., Nervous System Infections: No., Immunizations up to date: Yes.   Past Medical History Comments: See history  Surgical History Past Surgical History:  Procedure Laterality Date  . adenoids   2004   Removed  . ear tubes removed  2002 and 2009  . TYMPANOSTOMY TUBE PLACEMENT  1999    Family History family history is not on file. Family History is otherwise negative for migraines, seizures, cognitive impairment, blindness, deafness, birth defects, chromosomal disorder, autism.  Social History Social History   Social History  . Marital status: Single    Spouse name: N/A  . Number of children: N/A  . Years of education: N/A   Social History Main Topics  . Smoking status: Never Smoker  . Smokeless tobacco: Never Used  . Alcohol use No  . Drug use: No  . Sexual activity: No   Other Topics Concern  . 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 She is doing well in school and is doing a work study program with [employer]. [name] lives with her mom and also stays in her dorm. [name] enjoys reading and working with kids.    Allergies Allergies  Allergen Reactions  . Zithromax [Azithromycin] Hives and Rash    Physical Exam BP 90/60   Pulse 80   Ht 5' 1.25" (1.556 m)   Wt 152 lb 9.6 oz (69.2 kg)   LMP 01/02/2016   BMI 28.60 kg/m  General: well developed, well  nourished female, lying on exam table Head: normocephalic and atraumatic. Oropharynx benign.  Ears, Nose and Throat: Oropharynx benign.  Neck: supple with no carotid or supraclavicular bruits.  Respiratory: auscultation clear  Cardiovascular: regular rate and rhythm, no murmurs.  Musculoskeletal: no obvious deformities or scoliosis  Skin: no rashes or neurocutaneous lesions   Neurologic Exam  Mental Status: Awake and fully alert. Attention span, concentration, and fund of knowledge appropriate for age. Speech fluent without dysarthria. Able to follow commands and participate in examination.  Cranial Nerves: Fundoscopic exam reveal sharp disc margins. Pupils equal, briskly reactive to light. Extraocular movements full without nystagmus. Visual fields full to confrontation. Hearing intact and symmetric to whisper. Facial sensation intact. Face, tongue, palate move normally and symmetrically. Neck flexion and extension normal.  Motor: Normal bulk and tone. Normal strength in all tested extremity muscles  Sensory: Intact to touch and temperature in all extremities.  Coordination: Rapid movements: finger and toe tapping normal and symmetric bilaterally. Finger-to-nose and heel-to-shin intact bilaterally. Able to balance on either foot. Romberg negative.  Gait and Station: Arises from  chair without difficulty. Stance is normal. Gait demonstrates normal stride length and balance. Able to heel, toe, and tandem walk without difficulty.  Reflexes: diminished and symmetric. Toes downgoing. No clonus.   Impression 1. Migraine without aura, without status migrainosus, not intractable 2. Episodic tension headaches, not intractable 3. Migraine variant - ice pick headache 4. History of hypotension 5. Episodes of syncope with migraines  Recommendations for plan of care [facility]. [name] has neither had nor required imaging or lab studies since the last  visit. She is a 20 year old young woman with history of migraine and tension headaches. She was taking Divalproex ER 250mg  2 tablets for migraine prevention but stopped it some time ago for reasons that are unclear to me. She has taken Topiramate in the past and stopped it because she did not feel that it was beneficial and because she was concerned about interaction with her birth control pill. She took Atenolol but said that it made her asthma worse. She is experiencing frequent and severe migraines. I talked with [name] today and explained that options for migraine prevention are limited. I recommended a trial of Qudexy XR and explained that it is FDA approved for migraine prevention. I reviewed potential side effects and explained that we would start with low dose and increase the dose slowly. She agreed to try this medication and I asked her to call me or send a MyChart message in 1 week to let me know how she was doing.  I reminded [name] to avoid skipping meals, to get enough sleep and to be very well hydrated. I talked to her again about her low blood pressure and explained about the relationship between hydration, her blood pressure and her syncopal events when she has a migraine. We talked about her pain symptoms and I do not have an explanation for her description of vibration from her head to her feet when she has a migraine. She does not describe a Lhermitte's phenomen, and the sensation is only present during a migraine. She could be describing tingling, but it is difficult to understand at this point. I encouraged her to being trying to overhydrate her self with sports drinks at the onset of a migraine to try to prevent the dehydration that may be occurring and she agreed to do so.  I will see [name] back in follow up in 3 months but will be in contact with her in the interim by phone or MyChart.   The medication list was reviewed and reconciled.  I reviewed changes that were made in the prescribed  medications today.  A complete medication list was provided to the patient.  Allergies as of 01/19/2016      Reactions   Zithromax [azithromycin] Hives, Rash      Medication List       Accurate as of 01/19/16 11:59 PM. Always use your most recent med list.          aspirin-acetaminophen-caffeine 250-250-65 MG tablet Commonly known as:  EXCEDRIN MIGRAINE Take 2 tablets at onset of migraine. May repeat in 4-6 hours if needed.   cloNIDine 0.1 MG tablet Commonly known as:  CATAPRES Take 1 tablet 30 minutes before bedtime   ondansetron 4 MG disintegrating tablet Commonly known as:  ZOFRAN ODT Take 1 under the tongue at onset of nausea. May repeat in 4-6 hours if needed.   QUDEXY XR 25 MG Cs24 Generic drug:  Topiramate ER Take 1 capsule at bedtime  for 1 week, then take 2 capsules at bedtime   tiZANidine 4 MG tablet Commonly known as:  ZANAFLEX Take 1 tablet at onset of severe headache. May repeat in 6-8 hours if needed       Dr. Sharene Skeans was consulted regarding the patient.   Total time spent with the patient was 35 minutes, of which 50% or more was spent in counseling and coordination of care.   Elveria Rising NP-C

## 2016-01-19 NOTE — Patient Instructions (Signed)
For your migraines, we will try the medication Qudexy XR. This is an extended release Topiramate and is FDA approved for migraine prevention. It is generally well tolerated with very few side effects. It is important to drink water while you are taking this medication. I have given you samples to take for the first couple of months until we get to a dose that is working for you.  You will take this medication as follows:  Take 1 capsule at bedtime for 1 week, then take 2 capsules at bedtime thereafter.   Call me or send me a MyChart message in 1 week to let me know how are you are doing.   Your blood pressure remains low at 90/60 today. It is important for you to be drinking enough water each day. You need to be drinking at least 60 oz of water every day, and more on days when you are active or exposed to hot temperatures.   Please plan to follow up in 3 months or sooner if needed.

## 2016-01-21 DIAGNOSIS — R55 Syncope and collapse: Secondary | ICD-10-CM | POA: Insufficient documentation

## 2016-03-07 ENCOUNTER — Encounter (INDEPENDENT_AMBULATORY_CARE_PROVIDER_SITE_OTHER): Payer: Self-pay | Admitting: Family

## 2016-03-07 DIAGNOSIS — G4719 Other hypersomnia: Secondary | ICD-10-CM

## 2016-03-07 DIAGNOSIS — G43001 Migraine without aura, not intractable, with status migrainosus: Secondary | ICD-10-CM

## 2016-03-07 DIAGNOSIS — G478 Other sleep disorders: Secondary | ICD-10-CM

## 2016-03-07 NOTE — Telephone Encounter (Signed)
I called and talked to Anna Fitzpatrick. She said that she has been having ongoing problems with sleep and that it is affecting her schoolwork. Anna Fitzpatrick said that last week was particularly bad in that she felt an overwhelming desire to go to sleep during the days last week and had trouble maintaining wakefulness. She felt very sleepy, even after getting up and walking, and did not feel safe to drive. Anna Fitzpatrick says that she generally can go to sleep but that she awakens frequently during the night. She says that she is having trouble with daytime sleepiness this week as well, and that she slept last night, although with frequent awakenings. I recommended to Anna Fitzpatrick that we refer her to a sleep specialist to evaluate her problem, and that she may need to have a sleep study. Anna Fitzpatrick agreed as along as it was covered by insurance. I told her that my understanding is that the sleep center checks insurance as part of their scheduling process. Anna Fitzpatrick asked for a note to be sent to school for last week regarding her missed time from class, due to excessive sleepiness. I told her that I would send the note. TG

## 2016-03-13 ENCOUNTER — Encounter: Payer: Self-pay | Admitting: Neurology

## 2016-03-13 ENCOUNTER — Ambulatory Visit (INDEPENDENT_AMBULATORY_CARE_PROVIDER_SITE_OTHER): Payer: BLUE CROSS/BLUE SHIELD | Admitting: Neurology

## 2016-03-13 VITALS — BP 102/60 | HR 78 | Resp 16 | Ht 61.25 in | Wt 153.0 lb

## 2016-03-13 DIAGNOSIS — G43719 Chronic migraine without aura, intractable, without status migrainosus: Secondary | ICD-10-CM

## 2016-03-13 DIAGNOSIS — G4730 Sleep apnea, unspecified: Secondary | ICD-10-CM

## 2016-03-13 DIAGNOSIS — G4710 Hypersomnia, unspecified: Secondary | ICD-10-CM | POA: Diagnosis not present

## 2016-03-13 DIAGNOSIS — R0683 Snoring: Secondary | ICD-10-CM

## 2016-03-13 DIAGNOSIS — F5103 Paradoxical insomnia: Secondary | ICD-10-CM | POA: Diagnosis not present

## 2016-03-13 NOTE — Patient Instructions (Signed)

## 2016-03-13 NOTE — Progress Notes (Signed)
SLEEP MEDICINE CLINIC   Provider:  Melvyn Novas, M D  Referring Provider: Primary Care Physician: Elveria Rising , NP   Chief Complaint  Patient presents with  . Sleep Consult    Rm 10. Patient has trouble falling and staying asleep, snores, wakes up feeling tired, morning headaches, daytime fatigue, may take a nap.     HPI:  Anna Fitzpatrick is a 21 y.o. female , seen here as a referral from Elveria Rising, NP at [facility],  Anna Fitzpatrick is a 5 year old right-handed afro [place] female, who appears very sleepy as I enter the room. She does suffer from sleep problems and migraines. Her history of headaches began in 2010. Liver different types of headaches described which have been followed by Dr. Ellison Carwin. Some of her headaches were tension headaches others migraine without visual aura but with severe nausea and a certain type was identified is an ice pick headache. She describes that her migraines usually occur during the day and may increase as the day goes on, and that she sometimes escapes him to sleep as the pain is so severe that she has to retreat to a dark and quiet room. From 2015 3 2017, she took topiramate for migraine prevention but did not find it particularly beneficial and it also could interfere with her oral contraceptives. She used atenolol for migraine prevention but it interfered with her asthma condition. January 2000 70s her headaches became daily and barely manageable and seemed to have been set off by a sinus infection followed by a weeklong migraine that ultimately led to an emergency room visit. Valproic acid was recommended for migraine prevention, but she decided against it- not knowing about teratogenic effects.  Anna Fitzpatrick is now in college and has to work computer screen she reads at these activities can trigger migraines. Since being a college student she had some difficulty sleeping and she was given a course of Klonopin and sleep  hygiene instructions to get her sleep schedule established.  She still complains of severe pain with her migraines painful vibration in her head, sometimes her pain seems to radiate out to the extremities. This vibratory dysesthesia only occurs while she has a migraine. She can still to be very nauseated and is unable to eat or drink and is intolerant of light and sound.  As to her sleep habits she states that she gets 8 hours of bedrest but currently awakens at night. She had her adenoids removed (but left her tonsils ) in 2004 ,ear tubes removed in 2002 and 2009  Yesterday night , she had only 3 hours of sleep.   Sleep habits are as follows: She gest tired around 9 or 10 PM and moves to the bedroom. She will some times go straight to sleep, other times, it will take hours. She has not been able to identify a cyclic pattern it seems to be sporadic how well she sleeps, how long she sleeps, and how long she lays awake before falling asleep. She has light blocking curtains, the bedroom is described as quiet, cool, dark. She does not watch TV in her bedroom and usually does not use other screen light devices in the bedroom. When going to sleep is usually on her side, but she may find herself supine when she wakes up. She uses one pillow for head support and a body pillow. She does not report bathroom breaks regularly at night. When she wakes up, it seems to be a spontaneous arousal  him a unrelated to discomfort, headaches, nausea, palpitations or diaphoresis. She rises between 7 -9 AM, spontaneously . She uses an alarm only if she has an early morning appointment. Seldomly does she wake up with headaches.   All classes this semester start at 1 PM, and she has usually migraine onset in late AM or after lunch.    Sleep medical history and family sleep history:  No family history known- her mother raised her.   Social history:  Single, no tobacco use, no ETOH, caffeine : she drinks a cola when she has a  migraine, no coffee, no tea.   Review of Systems: Out of a complete 14 system review, the patient complains of only the following symptoms, and all other reviewed systems are negative. Patient reports daytime sleepiness, insomnia, weakness, nausea with headaches, asthma, history of migraine, Patient snores, observation by mother and room mate at dormitory.  Epworth score 15/ 24  , Fatigue severity score 52/ 63  , depression score 1/10    Social History   Social History  . Marital status: Single    Spouse name: N/A  . Number of children: 0  . Years of education: college   Occupational History  . Student    Social History Main Topics  . Smoking status: Never Smoker  . Smokeless tobacco: Never Used  . Alcohol use No  . Drug use: No  . Sexual activity: No   Other Topics Concern  . 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 Anna Fitzpatrick lives with her mom and also stays in her dorm. Anna Fitzpatrick enjoys reading and working with kids.       No family history on file.  Past Medical History:  Diagnosis Date  . Asthma   . XLKGMWNU(272.5Headache(784.0)     Past Surgical History:  Procedure Laterality Date  . adenoids   2004   Removed  . ear tubes removed  2002 and 2009  . TYMPANOSTOMY TUBE PLACEMENT  1999    Current Outpatient Prescriptions  Medication Sig Dispense Refill  . aspirin-acetaminophen-caffeine (EXCEDRIN MIGRAINE) 250-250-65 MG tablet Take 2 tablets at onset of migraine. May repeat in 4-6 hours if needed. 30 tablet 0  . cloNIDine (CATAPRES) 0.1 MG tablet Take 1 tablet 30 minutes before bedtime 30 tablet 1  . ondansetron (ZOFRAN ODT) 4 MG disintegrating tablet Take 1 under the tongue at onset of nausea. May repeat in 4-6 hours if needed. 30 tablet 3  . QUDEXY XR 25 MG CS24 Take 1 capsule at bedtime for 1 week, then take 2 capsules at bedtime 30 each  0  . tiZANidine (ZANAFLEX) 4 MG tablet Take 1 tablet at onset of severe headache. May repeat in 6-8 hours if needed 20 tablet 1   No current facility-administered medications for this visit.     Allergies as of 03/13/2016 - Review Complete 03/13/2016  Allergen Reaction Noted  . Zithromax [azithromycin] Hives and Rash 08/07/2012    Vitals: BP 102/60   Pulse 78   Resp 16   Ht 5' 1.25" (1.556 m)   Wt 153 lb (69.4 kg)   BMI 28.67 kg/m  Last Weight:  Wt Readings from Last 1 Encounters:  03/13/16 153 lb (69.4 kg)   DGU:YQIHBMI:Body mass index is 28.67 kg/m.     Last Height:   Ht Readings from Last 1  Encounters:  03/13/16 5' 1.25" (1.556 m)    Physical exam:  General: The patient is awake, alert and appears not in acute distress. The patient is well groomed. Head: Normocephalic, atraumatic. Neck is supple. Mallampati 3,  neck circumference:13.5 . Nasal airflow patent, TMJ click is not evident . Retrognathia is seen.  Cardiovascular:  Regular rate and rhythm, without  murmurs or carotid bruit, and without distended neck veins. Respiratory: Lungs are clear to auscultation. Skin:  Without evidence of edema, or rash Trunk: BMI is normal. The patient's posture is erect.   Neurologic exam : The patient is awake and alert, oriented to place and time.   Memory subjective described as intact.  Attention span & concentration ability appears normal.  Speech is fluent,  without  dysarthria, dysphonia or aphasia.  Mood and affect are appropriate.  Cranial nerves: Pupils are equal and briskly reactive to light. Funduscopic exam without evidence of pallor or edema. Extraocular movements  in vertical and horizontal planes intact and without nystagmus. Visual fields by finger perimetry are intact. Hearing to finger rub intact.  Facial sensation intact to fine touch. Facial motor strength is symmetric and tongue and uvula move midline. Shoulder shrug was symmetrical.   Motor exam:   Normal tone,  muscle bulk and symmetric strength in all extremities.  Sensory:  Fine touch, pinprick and vibration were tested in all extremities. Proprioception tested in the upper extremities was normal.  Coordination: Rapid alternating movements in the fingers/hands was normal. Finger-to-nose maneuver  normal without evidence of ataxia, dysmetria or tremor.  Gait and station: Patient walks without assistive device and is able unassisted to climb up to the exam table. Strength within normal limits.  Stance is stable and normal. Turns with 3 Steps. Romberg testing is negative.  Deep tendon reflexes: in the  upper and lower extremities are symmetric and intact.  The patient was advised of the nature of the diagnosed sleep disorder , the treatment options and risks for general a health and wellness arising from not treating the condition.   I spent more than 45  minutes of face to face time with the patient. Greater than 50% of time was spent in counseling and coordination of care. We have discussed the diagnosis and differential and I answered the patient's questions.     Dear Nurse Practitioner Goodpasture and Dr. Sharene Skeans,   Assessment:  After physical and neurologic examination, review of laboratory studies,  Personal review of imaging studies,  Results of neurophysiology testing and pre-existing records as far as provided in visit., my assessment is   1) frequent migrainous headaches without aura and without an identified trigger thus far.   Her pediatric neurologist is concerned that it may be sleep deprivation, related to  her pattern of  Sporadic non chronic insomnia that triggers  migrainous headaches.  Friends and her mother have witnessed her to snore, but there is no report of apnea being present.   She often wakes up feeling that she would like more sleep but cannot get more sleep. Her headaches are usually not causing an arousal and then not necessarily present in the morning but seemed to  develop over the later hours of the day. She also is neither morbidly obese nor medications that would trigger a sleep disorder.  She responds with hypersomnia, at an Epworth score of 15/ 24 and vehemently denies depression. Anxiety, racing thoughts. The highest points on the Epworth score were given for sitting and reading, as a passenger in  a car for an hour without a break, and lying down to rest in the afternoon. These 3 questions endorsed 3 points each. She also endorsed hypersomnia after lunch at 2 points and by watching television at 2 points. She has never fallen asleep in the driver of a car or while operating machinery.  She has sometimes vivid dreams, isolated sleep paralysis, not hypnagogic hallucinations , neither cataplexy.   I will order an attended split-night polysomnography for this patient, with capnography. I hope to identify if sleep disordered breathing contributes to her frequent retro-orbital migraines.  Please don't hesitate to contact me if you have further questions, sincerely  Melvyn Novas MD  03/13/2016   CC:Dr Hickling , Elveria Rising, NP

## 2016-03-14 ENCOUNTER — Encounter (INDEPENDENT_AMBULATORY_CARE_PROVIDER_SITE_OTHER): Payer: Self-pay | Admitting: Family

## 2016-03-20 ENCOUNTER — Encounter (INDEPENDENT_AMBULATORY_CARE_PROVIDER_SITE_OTHER): Payer: Self-pay | Admitting: Family

## 2016-03-21 ENCOUNTER — Telehealth: Payer: Self-pay | Admitting: Neurology

## 2016-03-21 DIAGNOSIS — G4710 Hypersomnia, unspecified: Secondary | ICD-10-CM

## 2016-03-21 DIAGNOSIS — F5103 Paradoxical insomnia: Secondary | ICD-10-CM

## 2016-03-21 DIAGNOSIS — R0683 Snoring: Secondary | ICD-10-CM

## 2016-03-21 DIAGNOSIS — G4730 Sleep apnea, unspecified: Secondary | ICD-10-CM

## 2016-03-21 DIAGNOSIS — G43719 Chronic migraine without aura, intractable, without status migrainosus: Secondary | ICD-10-CM

## 2016-03-21 NOTE — Telephone Encounter (Signed)
[employer] denied Split.  Can I get an order for HST? °

## 2016-03-22 NOTE — Telephone Encounter (Signed)
Split cancelled and Home Sleep Test ordered

## 2016-03-22 NOTE — Telephone Encounter (Signed)
Thank You,  Lafonda Mossesiana. CD

## 2016-03-29 ENCOUNTER — Encounter (INDEPENDENT_AMBULATORY_CARE_PROVIDER_SITE_OTHER): Payer: Self-pay | Admitting: Family

## 2016-03-29 ENCOUNTER — Institutional Professional Consult (permissible substitution): Payer: BLUE CROSS/BLUE SHIELD | Admitting: Neurology

## 2016-03-29 ENCOUNTER — Encounter: Payer: BLUE CROSS/BLUE SHIELD | Admitting: Neurology

## 2016-03-29 NOTE — Progress Notes (Signed)
Patient: Anna Fitzpatrick MRN: [id number] Sex: female DOB: 02-17-1995  Provider: Elveria Rising, NP Location of Care: [facility]  Note type: Routine return visit  History of Present Illness: Referral Source: Blair Heys, MD History from: patient and [facility] chart Chief Complaint: Migraine without aura and with status migrainosus, not intractable  LETTY Fitzpatrick is a 21 y.o. young woman with history of headaches that began in 2010. 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 She was last seen January 19, 2016. Anna Fitzpatrick took Topiramate for migraine prevention in 2015 but stopped it on her own because she felt that it was not particularly beneficial and because she was concerned about it making her oral contraceptive less effective. She fortunately had improvement in her headaches until September 2016, and when I saw her in October, recommended Atenolol for migraine prevention. She stopped it because she felt that it was worsening her asthma. Anna Fitzpatrick said that she continued to have frequent headaches but that it was manageable until January 2017 when the headaches became daily. She had a sinus infection in January 2017 along with a week long migraine that was ultimately treated in the ER. When she was seen in the office in March 12, 2015, Divalproex ER 250mg  was recommended for migraine prevention.She stopped this medication for reasons that were unclear. When Anna Fitzpatrick was last seen, she was continuing to have frequent migraines, and I recommended a trial of Qudexy XR 25mg . She communicated with me via MyChart and reported that her migraines had improved on this medication.   Anna Fitzpatrick has also reported significant problems with insomnia and daytime sleepiness. I referred her to Dr Porfirio Mylar Dohmeier for sleep evaluation. Anna Fitzpatrick had a home sleep study last night and said that it did not go well because she awakened frequently with the equipment being pulled  off.   Anna Fitzpatrick reports that working at [employer], reading and watching TV or videos can trigger migraines. She also has some difficulty sleeping, and I gave her a course of Clonidine along with instructions on sleep hygiene to try to get her sleep schedule back on track.   Anna Fitzpatrick is lying down on the exam table today and says that she developed a migraine earlier this morning after a night of poor sleep as previously mentioned.   Anna Fitzpatrick says that she has been otherwise healthy since she was last seen. 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 She hopes to return to graduate school at some point as she hopes to become a psychologist. Anna Fitzpatrick has no other health concerns today other than previously mentioned.  Review of Systems: Please see the HPI for neurologic and other pertinent review of systems. Otherwise, the following systems are noncontributory including constitutional, eyes, ears, nose and throat, cardiovascular, respiratory, gastrointestinal, genitourinary, musculoskeletal, skin, endocrine, hematologic/lymph, allergic/immunologic and psychiatric.   Past Medical History:  Diagnosis Date  . Asthma   . Headache(784.0)    Hospitalizations: No., Head Injury: No., Nervous System Infections: No., Immunizations up to date: Yes.   Past Medical History Comments: See history  Surgical History Past Surgical History:  Procedure Laterality Date  . adenoids   2004   Removed  . ear tubes removed  2002 and 2009  . TYMPANOSTOMY TUBE PLACEMENT  1999    Family History family history is not on file. Family History is otherwise negative for migraines, seizures, cognitive impairment, blindness,  deafness, birth defects, chromosomal disorder, autism.  Social History Social History   Social History  . Marital status: Single    Spouse name: N/A  . Number of children: 0  . Years of education: college   Occupational  History  . Student    Social History Main Topics  . Smoking status: Never Smoker  . Smokeless tobacco: Never Used  . Alcohol use No  . Drug use: No  . Sexual activity: No   Other Topics Concern  . 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 She is doing well in school and is doing a work study program with [employer]. Baylen lives with her mom and also stays in her dorm. Uyen enjoys reading and working with kids.       Allergies Allergies  Allergen Reactions  . Zithromax [Azithromycin] Hives and Rash    Physical Exam BP 96/60   Pulse 84   Ht 5' 1.5" (1.562 m)   Wt 151 lb 9.6 oz (68.8 kg)   LMP 03/17/2016 (Within Days)   BMI 28.18 kg/m  General: well developed, well nourished female, lying on exam table Head: normocephalic and atraumatic. Oropharynx benign.  Ears, Nose and Throat: Oropharynx benign.  Neck: supple with no carotid or supraclavicular bruits.  Respiratory: auscultation clear  Cardiovascular: regular rate and rhythm, no murmurs.  Musculoskeletal: no obvious deformities or scoliosis  Skin: no rashes or neurocutaneous lesions   Neurologic Exam  Mental Status: Awake and fully alert. Attention span, concentration, and fund of knowledge appropriate for age. Speech fluent without dysarthria. Able to follow commands and participate in examination.  Cranial Nerves: Fundoscopic exam reveal sharp disc margins. Pupils equal, briskly reactive to light. Extraocular movements full without nystagmus. Visual fields full to confrontation. Hearing intact and symmetric to whisper. Facial sensation intact. Face, tongue, palate move normally and symmetrically. Neck flexion and extension normal.  Motor: Normal bulk and tone. Normal strength in all tested extremity muscles  Sensory: Intact to touch and temperature in all extremities.  Coordination: Rapid movements: finger and toe tapping normal and symmetric  bilaterally. Finger-to-nose and heel-to-shin intact bilaterally. Able to balance on either foot. Romberg negative.  Gait and Station: Arises from chair without difficulty. Stance is normal. Gait demonstrates normal stride length and balance. Able to heel, toe, and tandem walk without difficulty.  Reflexes: diminished and symmetric. Toes downgoing. No clonus.   Impression 1. Migraine without aura, without status migrainosus, not intractable 2. Episodic tension headaches, not intractable 3. Migraine variant - ice pick headache 4. History of hypotension 5. Episodes of syncope with migraines 6. Insomnia with excessive daytime sleepiness  Recommendations for plan of care The patient's previous [facility] records were reviewed. Rudy has neither had nor required imaging or lab studies since the last visit. She is a 21 year old young woman with history of migraine and tension headaches. She is taking and tolerating Qudexy XR 25mg  for migraine prevention and has experienced improvement in her migraines. She is undergoing sleep evaluation by Dr Porfirio Mylar Dohmeier for problems with insomnia and excessive daytime sleepiness.   I reminded Fadia about the importance of not skipping meals and being well hydrated. 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 I will see Serrita back in follow up in 3 months but will be in contact with her in the interim by  phone or MyChart.   The medication list was reviewed and reconciled.  No changes were made in the prescribed medications today.  A complete medication list was provided to the patient/caregiver.  Allergies as of 03/30/2016      Reactions   Zithromax [azithromycin] Hives, Rash      Medication List       Accurate as of 03/30/16  3:50 PM. Always use your most recent med list.          aspirin-acetaminophen-caffeine 250-250-65 MG tablet Commonly known as:  EXCEDRIN MIGRAINE Take  2 tablets at onset of migraine. May repeat in 4-6 hours if needed.   cloNIDine 0.1 MG tablet Commonly known as:  CATAPRES Take 1 tablet 30 minutes before bedtime   ondansetron 4 MG disintegrating tablet Commonly known as:  ZOFRAN ODT Take 1 under the tongue at onset of nausea. May repeat in 4-6 hours if needed.   QUDEXY XR 25 MG Cs24 Generic drug:  Topiramate ER Take 1 capsule at bedtime for 1 week, then take 2 capsules at bedtime   tiZANidine 4 MG tablet Commonly known as:  ZANAFLEX Take 1 tablet at onset of severe headache. May repeat in 6-8 hours if needed       Total time spent with the patient was 25 minutes, of which 50% or more was spent in counseling and coordination of care.   Anna Risingina Ravindra Baranek NP-C

## 2016-03-30 ENCOUNTER — Encounter (INDEPENDENT_AMBULATORY_CARE_PROVIDER_SITE_OTHER): Payer: Self-pay | Admitting: Family

## 2016-03-30 ENCOUNTER — Ambulatory Visit (INDEPENDENT_AMBULATORY_CARE_PROVIDER_SITE_OTHER): Payer: BLUE CROSS/BLUE SHIELD | Admitting: Family

## 2016-03-30 VITALS — BP 96/60 | HR 84 | Ht 61.5 in | Wt 151.6 lb

## 2016-03-30 DIAGNOSIS — G43809 Other migraine, not intractable, without status migrainosus: Secondary | ICD-10-CM

## 2016-03-30 DIAGNOSIS — G43001 Migraine without aura, not intractable, with status migrainosus: Secondary | ICD-10-CM | POA: Diagnosis not present

## 2016-03-30 DIAGNOSIS — R55 Syncope and collapse: Secondary | ICD-10-CM | POA: Diagnosis not present

## 2016-03-30 DIAGNOSIS — G44219 Episodic tension-type headache, not intractable: Secondary | ICD-10-CM

## 2016-03-30 MED ORDER — ONDANSETRON 4 MG PO TBDP: ORAL_TABLET | ORAL | 3 refills | Status: DC

## 2016-03-30 NOTE — Patient Instructions (Signed)
Continue taking the Qudexy XR 25mg  daily for migraine prevention.   Keep track of your migraines and let me know if they become more frequent or more severe.   Continue to follow up with Dr Vickey [facility] as you have been doing about your problems with sleep.   I will send in the note to your school today.   I have sent in the refill for the nausea medication (Ondansetron).   Please plan to return for follow up in May - perhaps after graduation and before you start your new job.

## 2016-04-06 ENCOUNTER — Encounter (INDEPENDENT_AMBULATORY_CARE_PROVIDER_SITE_OTHER): Payer: Self-pay | Admitting: Family

## 2016-04-06 NOTE — Telephone Encounter (Signed)
°  Who's calling (name and relationship to patient) : Johnn HaiZarria Covault Best contact number: [phone] or [phone] Provider they see:  Reason for call: Patient stated school said they would need a note from you.    PRESCRIPTION REFILL ONLY  Name of prescription:  Pharmacy:

## 2016-04-06 NOTE — Telephone Encounter (Signed)
I called and talked to Anna Fitzpatrick. She said that she overslept slightly this morning and then when she awakened, she got up quickly because someone was at her door. She got up to answer the door, then had to lie down because she felt unusually weak, hot and sweaty. She cooled off and rested a few minutes, then showered and left the house quickly to take her sister to school. Then she stopped at a gas station for gas, and while standing in line to pay, she fainted. She said that a policeman arrived on the scene before the ambulance and since she had recovered before the ambulance arrived, the policeman took her home. She said that when she fainted she was caught as she fell by other people standing in line so she was not injured when she fell. Anna Fitzpatrick said that she had a slight headache when she awakened from the faint but that had resolved. Anna Fitzpatrick said that she had not eaten breakfast or drank any fluids prior to leaving the house this morning. Anna Fitzpatrick said that she usually never eats breakfast because she does not feel hungry in the mornings. She said that she feels very tired now but otherwise ok. She is worried that she has a new problem that caused her to faint. I talked to Anna Fitzpatrick about the events this morning. I explained to her that her blood pressures tend to run on the lower side of normal, and that what she likely experienced was a low blood pressure event related to hypovolemia. I explained to her that when low blood pressures occur when a person is standing, that the body's response is to protect vital organs, and the result is that the person faints. The body falls, and the blood pressure is normalized, redirecting blood flow to all vital organs. I explained to her that she needs to start drinking fluids as soon as she awakens each day and continue drinking fluids as the day goes on. I told her that as we have discussed in the past, she needs to be drinking a minimum of 60 oz of caffeine & sugar free fluids on  days that she is active and on her feet, more on days that she is exposed to hot temperatures. We also talked about the need for her to eat a small breakfast and reminded her about the effect of skipping meals on headaches, and that starting the day with some food will also help with keeping her blood pressure at a more normal reading. I reassured Anna Fitzpatrick that she does not have a new or ominous problem. I instructed her to rest today and drink fluids as we discussed. I will send a note to her school for missed classes. TG

## 2016-04-06 NOTE — Telephone Encounter (Signed)
I have also already faxed a note to [employer]. TG

## 2016-04-06 NOTE — Telephone Encounter (Signed)
I called both numbers at 11:20 and left messages for the patient to call back.  Did not see Anna Fitzpatrick's note until now.

## 2016-04-10 ENCOUNTER — Ambulatory Visit (INDEPENDENT_AMBULATORY_CARE_PROVIDER_SITE_OTHER): Payer: BLUE CROSS/BLUE SHIELD | Admitting: Family

## 2016-05-01 ENCOUNTER — Encounter (INDEPENDENT_AMBULATORY_CARE_PROVIDER_SITE_OTHER): Payer: Self-pay | Admitting: Family

## 2016-05-09 ENCOUNTER — Telehealth: Payer: Self-pay

## 2016-05-09 NOTE — Telephone Encounter (Signed)
Pt called on 03/30/16 to pick up HST and re-do test.  Pt never returned to pick up.

## 2016-05-18 ENCOUNTER — Encounter (INDEPENDENT_AMBULATORY_CARE_PROVIDER_SITE_OTHER): Payer: Self-pay | Admitting: Family

## 2016-05-25 ENCOUNTER — Encounter (INDEPENDENT_AMBULATORY_CARE_PROVIDER_SITE_OTHER): Payer: Self-pay | Admitting: Family

## 2016-05-29 ENCOUNTER — Encounter (INDEPENDENT_AMBULATORY_CARE_PROVIDER_SITE_OTHER): Payer: Self-pay | Admitting: Family

## 2016-05-29 NOTE — Telephone Encounter (Signed)
I called and talked with Anna Fitzpatrick. She said that she was sick at the end of last week with a "stomach virus", then on the weekend developed cold symptoms. She went to Urgent Care today because she has continued to have vomiting in addition to cold symptoms. She said that she was prescribed a prednisone taper and wanted to make sure that it was ok to take with her history of migraines and other medications. I told her that it would not interfere with her other medicines and was ok to take with her migraine history. She said that she is supposed to follow up with Urgent Care in 3 days if she has not shown improvement. TG

## 2016-05-30 ENCOUNTER — Encounter (INDEPENDENT_AMBULATORY_CARE_PROVIDER_SITE_OTHER): Payer: Self-pay | Admitting: Family

## 2016-05-31 ENCOUNTER — Encounter (INDEPENDENT_AMBULATORY_CARE_PROVIDER_SITE_OTHER): Payer: Self-pay | Admitting: Family

## 2016-06-01 ENCOUNTER — Encounter (INDEPENDENT_AMBULATORY_CARE_PROVIDER_SITE_OTHER): Payer: Self-pay | Admitting: Family

## 2016-06-01 ENCOUNTER — Telehealth (INDEPENDENT_AMBULATORY_CARE_PROVIDER_SITE_OTHER): Payer: Self-pay | Admitting: Family

## 2016-06-01 NOTE — Telephone Encounter (Signed)
°  Who's calling (name and relationship to patient) : Anna Fitzpatrick (patient)  Best contact number: [phone]  Provider they see: [facility]  Reason for call: Patient call requesting a work excuse for the following days: April 19th(Th) and 20th(Fr) and April 24th(Tu) and 25th(Wed)      PRESCRIPTION REFILL ONLY  Name of prescription:  Pharmacy:

## 2016-06-01 NOTE — Telephone Encounter (Signed)
Please review

## 2016-06-01 NOTE — Telephone Encounter (Signed)
I contacted Anna Fitzpatrick via MyChart and let her know that I faxed the letter to school as requested. I asked her for more information regarding the letter for her work. I will send that when I have the information needed. TG

## 2016-06-01 NOTE — Telephone Encounter (Signed)
  Who's calling (name and relationship to patient) : Leva, self  Best contact number: [phone]  Provider they see: Elveria Rising, NP  Reason for call: Anna Fitzpatrick called in stating that she also needs a note faxed to [facility] for absences on April 19th & 20th and April 23rd & 24th. Please fax to the Attention: [employer], Fax#: [phone].  She also stated that she needs a Migraine Action Plan for her work along with a note giving a description of the diagnosis.  Stefannie can be reached at [phone] for any questions and to let her know when the letters have been sent to work/school.     PRESCRIPTION REFILL ONLY  Name of prescription:  Pharmacy:

## 2016-06-05 NOTE — Telephone Encounter (Signed)
I sent the migraine action plan to [facility] via MyChart. TG

## 2016-06-15 ENCOUNTER — Encounter (INDEPENDENT_AMBULATORY_CARE_PROVIDER_SITE_OTHER): Payer: Self-pay | Admitting: Family

## 2016-06-16 ENCOUNTER — Ambulatory Visit (INDEPENDENT_AMBULATORY_CARE_PROVIDER_SITE_OTHER): Payer: BLUE CROSS/BLUE SHIELD | Admitting: Family

## 2016-08-02 ENCOUNTER — Encounter (INDEPENDENT_AMBULATORY_CARE_PROVIDER_SITE_OTHER): Payer: Self-pay | Admitting: Family

## 2016-08-03 ENCOUNTER — Other Ambulatory Visit (INDEPENDENT_AMBULATORY_CARE_PROVIDER_SITE_OTHER): Payer: Self-pay | Admitting: Family

## 2016-08-03 DIAGNOSIS — G43009 Migraine without aura, not intractable, without status migrainosus: Secondary | ICD-10-CM

## 2016-08-03 MED ORDER — TIZANIDINE HCL 4 MG PO TABS: ORAL_TABLET | ORAL | 1 refills | Status: DC

## 2016-08-17 ENCOUNTER — Encounter (INDEPENDENT_AMBULATORY_CARE_PROVIDER_SITE_OTHER): Payer: Self-pay | Admitting: Family

## 2016-10-09 ENCOUNTER — Encounter (INDEPENDENT_AMBULATORY_CARE_PROVIDER_SITE_OTHER): Payer: Self-pay | Admitting: Family

## 2016-10-11 ENCOUNTER — Encounter (INDEPENDENT_AMBULATORY_CARE_PROVIDER_SITE_OTHER): Payer: Self-pay | Admitting: Family

## 2016-10-11 ENCOUNTER — Ambulatory Visit (INDEPENDENT_AMBULATORY_CARE_PROVIDER_SITE_OTHER): Payer: BLUE CROSS/BLUE SHIELD | Admitting: Family

## 2016-10-11 VITALS — BP 100/68 | HR 80 | Ht 62.25 in | Wt 152.4 lb

## 2016-10-11 DIAGNOSIS — R55 Syncope and collapse: Secondary | ICD-10-CM | POA: Diagnosis not present

## 2016-10-11 DIAGNOSIS — G43001 Migraine without aura, not intractable, with status migrainosus: Secondary | ICD-10-CM

## 2016-10-11 DIAGNOSIS — Z8679 Personal history of other diseases of the circulatory system: Secondary | ICD-10-CM

## 2016-10-11 DIAGNOSIS — G43809 Other migraine, not intractable, without status migrainosus: Secondary | ICD-10-CM

## 2016-10-11 DIAGNOSIS — G44219 Episodic tension-type headache, not intractable: Secondary | ICD-10-CM | POA: Diagnosis not present

## 2016-10-11 DIAGNOSIS — R404 Transient alteration of awareness: Secondary | ICD-10-CM | POA: Diagnosis not present

## 2016-10-11 NOTE — Patient Instructions (Signed)
Thank you for coming in today. Instructions for you until your next appointment are as follows: 1. We will perform an EEG to evaluate the vibration sensations that you are experiencing. I have scheduled the EEG for Friday September 7th at [facility]. Please arrive at admitting on the 1st floor by 10:45AM. After registering, they will direct you downstairs to the EEG lab. The EEG will take about 1 hour and 15 minutes.  2. Dr Sharene SkeansHickling or I will call you on Monday September 10th with the EEG results and recommendations.  3. Continue to work on Programmer, multimediahydrating yourself well. A good goal for you would be to be drinking at least 60 oz of water per day.  4. Keep track of your headaches using a headache diary for the next month so that we can see how frequent your headaches are. You may need to restart preventative medication.   Please plan to return for follow up in 4 weeks or sooner if needed.

## 2016-10-11 NOTE — Progress Notes (Signed)
Patient: Anna Fitzpatrick MRN: [id number] Sex: female DOB: 1995-11-10  Provider: Elveria Rising, NP Location of Care: [facility]  Note type: Routine return visit  History of Present Illness: Referral Source: Blair Heys, MD History from: patient and [facility] chart Chief Complaint: headaches  Anna Fitzpatrick is a 21 y.o. young woman with history of headaches that began in 2010. She has tension headaches, migraine without aura, ice pick headaches, insomnia, and history of syncopal episodes. She was last seen March 30, 2016. Anna Fitzpatrick has stayed in touch in the interim by MyChart. Anna Fitzpatrick has taken Topiramate for migraine prevention by stopped it on her own because she felt that it was not helpful and because she was concerned that it would make her oral contraceptive less effective. When she was last seen, I gave her samples of Qudexy XR, which she also stopped because her headaches improved. Anna Fitzpatrick contacted me earlier this week to report that she had been experiencing episodes of sensations of vibrations in her head and I asked her to come in for evaluation. Anna Fitzpatrick had reported this about 2 years ago and we had planned to perform an EEG but she had limitations with her insurance, and she elected not to proceed with that recommendation.   Anna Fitzpatrick tells me today that since her last visit, her headaches improved overall. She said that she continues to experience migraines but that they were more tolerable and that she rarely missed work. This is an improvement as while she was in school, she often missed several days or sometimes a week of school or work due to migraine. 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 She says that she enjoys the work and is happy in her job.   Anna Fitzpatrick tells me that at the end of the last week, while at work, she developed "vibrations" in her head that lasted seconds to minutes. When the vibrations  occurred, she said that she could not move, and afterwards she had a headache and that she felt mentally "foggy". She said that this sensation occurred 3 times in one day, and after it occurred twice at work, her employer sent her home. She said that her mother witnessed the 3rd event that day. She said that it has occurred a few other times since that first day last week and that the episodes have frightened her.   Anna Fitzpatrick also reports ringing in her ears and feeling dizzy at times. She has had problems with syncopal episodes in the past and has been working to keep herself well hydrated. She says that she does not skip meals, that she drinks water all day and that she usually sleeps well.   Anna Fitzpatrick says that she has been otherwise generally healthy since her last visit. She has no other health concerns today other than previously mentioned.  Review of Systems: Please see the HPI for neurologic and other pertinent review of systems. Otherwise, all other systems were reviewed and were negative.    Past Medical History:  Diagnosis Date  . Asthma   . Headache(784.0)    Hospitalizations: No., Head Injury: No., Nervous System Infections: No., Immunizations up to date: Yes.   Past Medical History Comments: See history   Surgical History Past Surgical History:  Procedure Laterality Date  . adenoids   2004   Removed  . ear tubes removed  2002 and 2009  . TYMPANOSTOMY TUBE PLACEMENT  1999    Family History  family history is not on file. Family History is otherwise negative for migraines, seizures, cognitive impairment, blindness, deafness, birth defects, chromosomal disorder, autism.  Social History Social History   Social History  . Marital status: Single    Spouse name: N/A  . Number of children: 0  . Years of education: college   Occupational History  . Student    Social History Main Topics  . Smoking status: Never Smoker  . Smokeless tobacco: Never Used  . Alcohol use No  .  Drug use: No  . Sexual activity: Not Currently    Partners: Male    Birth control/ protection: None   Other Topics Concern  . None   Social History Narrative   Anna Fitzpatrick is employed at [employer] in [place]. Lives with mom Anna Fitzpatrick enjoys reading and working with kids.       Allergies Allergies  Allergen Reactions  . Zithromax [Azithromycin] Hives and Rash    Physical Exam BP 100/68   Pulse 80   Ht 5' 2.25" (1.581 m)   Wt 152 lb 6.4 oz (69.1 kg)   LMP 09/19/2016   BMI 27.65 kg/m  General: well developed, well nourished, lying on exam table, in no evident distress, black hair, brown eyes, right handed Head: head normocephalic and atraumatic.  Oropharynx benign. Neck: supple with no carotid or supraclavicular bruits Cardiovascular: regular rate and rhythm, no murmurs Respiratory: breath sounds clear to ausculation Musculoskeletal: no deformities or obvious scoliosis Skin: No rashes or neurocutaneous lesions  Neurologic Exam Mental Status: Awake and fully alert.  Oriented to place and time.  Recent and remote memory intact.  Attention span, concentration, and fund of knowledge appropriate.  Mood and affect appropriate. Cranial Nerves: Fundoscopic exam reveals sharp disc margins.  Pupils equal, briskly reactive to light.  Extraocular movements full without nystagmus.  Visual fields full to confrontation.  Hearing intact and symmetric to finger rub.  Facial sensation intact.  Face tongue, palate move normally and symmetrically.  Neck flexion and extension normal. Motor: Normal bulk and tone. Normal strength in all tested extremity muscles. Sensory: Intact to touch and temperature in all extremities.  Coordination: Rapid alternating movements normal in all extremities.  Finger-to-nose and heel-to shin performed accurately bilaterally.  Romberg negative. Gait and Station: Arises from chair without difficulty.  Stance is normal. Gait demonstrates normal stride length and  balance.   Able to heel, toe and tandem walk without difficulty. Reflexes: Diminished and symmetric. Toes downgoing.  Impression 1. Migraine without aura, without status migrainosus, not intractable 2. Episodic tension headache, not intractable 3. Migraine variant - ice pick headache 4. History of hypotension 5. Episodes of syncope 6. Insomnia  Recommendations for plan of care The [facility] were [facility]. Anna Fitzpatrick has neither had nor required imaging or lab studies since the last visit. She is a 21 year old young woman with history of migraine and tension headaches, syncopal episodes, and insomnia. She has been doing well until last week. She reports episodes of vibrations in her head with being unable to move, headaches and feeling mentally foggy afterwards. I attempted to reassure Anna Fitzpatrick that these episodes likely represent symptoms of migraine but she remained concerned that there is some other problem rendering her unable to move her body. I recommended performing an EEG and scheduled that for later this week. I told Anna Fitzpatrick that I will call her with the results and recommendations for further treatment. I reminded her of the need for her to be  very well hydrated and to get sufficient rest. I asked Anna Fitzpatrick to keep a headache diary for the next 4 weeks and I will review the diary when I see her at that time. She agreed with the plans made today.   The medication list was reviewed and reconciled.  No changes were made in the prescribed medications today.  A complete medication list was provided to the patient.  Allergies as of 10/11/2016      Reactions   Zithromax [azithromycin] Hives, Rash      Medication List       Accurate as of 10/11/16 10:32 PM. Always use your most recent med list.          aspirin-acetaminophen-caffeine 250-250-65 MG tablet Commonly known as:  EXCEDRIN MIGRAINE Take 2 tablets at onset of migraine. May repeat in 4-6 hours if needed.             Discharge Care Instructions        Start     Ordered   10/11/16 0000  EEG Child    Comments:  Specimen [id number]: 21 year old woman with complaints of "vibration" in her head along with periods of being unable to move, followed by headache and feeling "foggy"   Question:  [facility] should this test be performed?  [facility]   10/11/16 1224      Dr. Sharene Skeans was consulted regarding the patient.   Total time spent with the patient was 25 minutes, of which 50% or more was spent in counseling and coordination of care.   Elveria Rising NP-C

## 2016-10-12 ENCOUNTER — Encounter (INDEPENDENT_AMBULATORY_CARE_PROVIDER_SITE_OTHER): Payer: Self-pay | Admitting: Family

## 2016-10-13 ENCOUNTER — Ambulatory Visit (HOSPITAL_COMMUNITY)
Admission: RE | Admit: 2016-10-13 | Discharge: 2016-10-13 | Disposition: A | Discharge status: Home / Self Care | Payer: BLUE CROSS/BLUE SHIELD | Source: Ambulatory Visit | Attending: Family | Admitting: Family

## 2016-10-13 DIAGNOSIS — R404 Transient alteration of awareness: Secondary | ICD-10-CM | POA: Diagnosis not present

## 2016-10-13 DIAGNOSIS — G4700 Insomnia, unspecified: Secondary | ICD-10-CM | POA: Diagnosis not present

## 2016-10-13 DIAGNOSIS — G43909 Migraine, unspecified, not intractable, without status migrainosus: Secondary | ICD-10-CM | POA: Diagnosis not present

## 2016-10-13 NOTE — Progress Notes (Signed)
OP adult EEG completed, results pending. 

## 2016-10-13 NOTE — Progress Notes (Signed)
Following EEG pt stated she had tingling throughout her body as well as dizziness and weakness.  Pt stated this is similar to prior episodes. Pt alert and oriented. Pt hesitant to leave EEG lab.  Offered to take pt to ED, refused despite staff recommendations. Pt stated it was passing. Kristeen Mansaryl Jones assisted in advice. Staff walked with pt to valet parking service.

## 2016-10-14 ENCOUNTER — Encounter (HOSPITAL_COMMUNITY): Payer: Self-pay

## 2016-10-14 HISTORY — PX: EEG ADULT: NEU1004

## 2016-10-14 NOTE — Procedures (Signed)
Patient: Anna Fitzpatrick MRN: [id number] Sex: female DOB: 12-14-1995  Clinical History: Anna Fitzpatrick is a 21 y.o. with tension and migraine headaches, migraine variants, insomnia, and syncopal episodes.  The patient has experienced sensations of vibrations in her head lasting seconds to minutes treatment time she was unable to move afterwards she had a headache and was mentally foggy.  This occurred 3 times in a day she was sent home from work after it happened twice.  Her mother witnessed the third event.  This study is performed to look for the presence of seizures.  Medications: none  Procedure: The tracing is carried out on a 32-channel digital Cadwell recorder, reformatted into 16-channel montages with 1 devoted to EKG.  The patient was awake during the recording.  The international 10/20 system lead placement used.  Recording time 33.5 minutes.   Description of Findings: Dominant frequency is 40 V, 10 Hz, alpha range activity that is well modulated and well regulated, posteriorly and symmetrically distributed, and attenuates with eye opening.    Background activity consists of predominantly under 10 V alpha and beta range activity.  She remains awake throughout the entire record.  There was no interictal epileptiform activity in the form of spikes or sharp waves.  Activating procedures included intermittent photic stimulation, and hyperventilation.  Intermittent photic stimulation induced a driving response at 8-653-21 Hz.  Hyperventilation caused no significant change in background activity.  EKG showed a regular sinus rhythm with a ventricular response of 90 beats per minute.  Impression: This is a normal record with the patient awake.  A normal EEG does not rule out the presence of seizures.  Ellison CarwinWilliam Hickling, MD

## 2016-10-16 ENCOUNTER — Encounter (INDEPENDENT_AMBULATORY_CARE_PROVIDER_SITE_OTHER): Payer: Self-pay | Admitting: Family

## 2016-10-16 ENCOUNTER — Telehealth (INDEPENDENT_AMBULATORY_CARE_PROVIDER_SITE_OTHER): Payer: Self-pay | Admitting: Family

## 2016-10-16 NOTE — Telephone Encounter (Signed)
I called report to patient - see phone note. TG

## 2016-10-16 NOTE — Telephone Encounter (Signed)
I called Anna Fitzpatrick and told her that the EEG that was done on Friday was normal and showed no evidence of seizures. I explained to her that the vibrations that she was experiencing was likely related to migraine or migraine aura, and when she had these episodes, to take Excedrin Migraine, drink fluids liberally and rest. She agreed with this plan. TG

## 2016-10-16 NOTE — Telephone Encounter (Signed)
Discussed with you.  I agree with this advice.

## 2016-11-08 ENCOUNTER — Ambulatory Visit (INDEPENDENT_AMBULATORY_CARE_PROVIDER_SITE_OTHER): Payer: BLUE CROSS/BLUE SHIELD | Admitting: Family

## 2016-11-08 ENCOUNTER — Encounter (INDEPENDENT_AMBULATORY_CARE_PROVIDER_SITE_OTHER): Payer: Self-pay | Admitting: Family

## 2016-11-08 DIAGNOSIS — G43009 Migraine without aura, not intractable, without status migrainosus: Secondary | ICD-10-CM

## 2016-11-08 MED ORDER — PROMETHAZINE HCL 25 MG PO TABS: 25.0000 mg | ORAL_TABLET | Freq: Four times a day (QID) | ORAL | 0 refills | Status: DC | PRN

## 2016-12-10 ENCOUNTER — Encounter (HOSPITAL_COMMUNITY): Payer: Self-pay | Admitting: Emergency Medicine

## 2016-12-10 ENCOUNTER — Ambulatory Visit (HOSPITAL_COMMUNITY)
Admission: EM | Admit: 2016-12-10 | Discharge: 2016-12-10 | Disposition: A | Discharge status: Home / Self Care | Payer: BLUE CROSS/BLUE SHIELD | Attending: Internal Medicine | Admitting: Internal Medicine

## 2016-12-10 DIAGNOSIS — Z3202 Encounter for pregnancy test, result negative: Secondary | ICD-10-CM | POA: Diagnosis not present

## 2016-12-10 DIAGNOSIS — R112 Nausea with vomiting, unspecified: Secondary | ICD-10-CM | POA: Diagnosis not present

## 2016-12-10 DIAGNOSIS — R110 Nausea: Secondary | ICD-10-CM

## 2016-12-10 LAB — POCT I-STAT, CHEM 8
BUN: 10 mg/dL (ref 6–20)
CALCIUM ION: 1.17 mmol/L (ref 1.15–1.40)
CREATININE: 0.8 mg/dL (ref 0.44–1.00)
Chloride: 104 mmol/L (ref 101–111)
Glucose, Bld: 84 mg/dL (ref 65–99)
HCT: 40 % (ref 36.0–46.0)
Hemoglobin: 13.6 g/dL (ref 12.0–15.0)
Potassium: 3.7 mmol/L (ref 3.5–5.1)
Sodium: 141 mmol/L (ref 135–145)
TCO2: 25 mmol/L (ref 22–32)

## 2016-12-10 LAB — POCT PREGNANCY, URINE: Preg Test, Ur: NEGATIVE

## 2016-12-10 MED ORDER — PANTOPRAZOLE SODIUM 40 MG PO TBEC: 40.0000 mg | DELAYED_RELEASE_TABLET | Freq: Two times a day (BID) | ORAL | 0 refills | Status: DC

## 2016-12-10 MED ORDER — ONDANSETRON 4 MG PO TBDP: 4.0000 mg | ORAL_TABLET | Freq: Two times a day (BID) | ORAL | 0 refills | Status: DC | PRN

## 2016-12-10 NOTE — ED Provider Notes (Signed)
[facility]    CSN: [id number] Arrival date & time: 12/10/16  1848     History   Chief Complaint Chief Complaint  Patient presents with  . Nausea  . Emesis    HPI Anna Fitzpatrick is a 21 y.o. female.   HPI Patient comes in to GERD care for evaluation of nausea and vomiting 3 months. Patient states that she reported no nausea vomiting to her OB/GYN nurse practitioner and had a "full workup" September 2018. Provider is with [facility] and we do not have access to any other records.  States of the workup was negative but she does not recall all the labs that were done. Does state that pregnancy test at that time was negative. Has nausea daily that comes and goes. Can be triggered at times with eating, smelling foods. Vomited twice yesterday and also a few times earlier in the week. Nonprojectile. States that she is not sure of the color. Patient reports having a bowel movement daily. No abdominal pain. is not sure if  she has acid reflux. Denies melena, hematochezia, hematemesis dysuria, hematuria. She does not have a primary care physician. Has not been referred to a gastroenterologist. Patient has history of severe migraines but her current complaint is not associated with headache. Complaint has affected her appetite. Past Medical History:  Diagnosis Date  . Asthma   . [id number](478.2)     Patient Active Problem List   Diagnosis Date Noted  . Syncope 01/21/2016  . 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 History of hypotension 11/20/2013  . Migraine without aura 08/07/2012  . Episodic tension type headache 08/07/2012    Past Surgical History:  Procedure Laterality Date  . adenoids   2004   Removed  . ear tubes removed  2002 and 2009  . EEG ADULT  10/14/2016      . TYMPANOSTOMY TUBE PLACEMENT  1999    OB History    No data available       Home Medications    Prior to Admission medications   Medication Sig Start Date End Date Taking?  Authorizing Provider  aspirin-acetaminophen-caffeine (EXCEDRIN MIGRAINE) [phone] MG tablet Take 2 tablets at onset of migraine. May repeat in 4-6 hours if needed. 05/14/15   Elveria Rising, NP  ondansetron (ZOFRAN ODT) 4 MG disintegrating tablet Take 1 tablet (4 mg total) 2 (two) times daily as needed by mouth for nausea or vomiting. 12/10/16   Naida Sleight, PA-C  pantoprazole (PROTONIX) 40 MG tablet Take 1 tablet (40 mg total) 2 (two) times daily before a meal by mouth. 12/10/16   Naida Sleight, PA-C    Family History No family history on file.  Social History Social History   Tobacco Use  . Smoking status: Never Smoker  . Smokeless tobacco: Never Used  Substance Use Topics  . Alcohol use: No    Alcohol/week: 0.0 oz  . Drug use: No     Allergies   Zithromax [azithromycin]   Review of Systems Review of Systems  Constitutional: Positive for appetite change. Negative for chills and fever.  HENT: Negative for sore throat and trouble swallowing.   Respiratory: Negative.   Cardiovascular: Negative.   Gastrointestinal: Positive for nausea and vomiting. Negative for abdominal distention, abdominal pain, blood in stool, constipation and diarrhea.  Genitourinary: Negative.   Neurological: Positive for headaches.  Psychiatric/Behavioral: Negative.      Physical Exam Triage Vital Signs ED Triage Vitals [12/10/16 1859]  Enc  Vitals Group     BP 111/73     Pulse Rate 84     Resp 18     Temp 98.8 F (37.1 C)     Temp src      SpO2 100 %     Weight      Height      Head Circumference      Peak Flow      Pain Score      Pain Loc      Pain Edu?      Excl. in GC?    No data found.  Updated Vital Signs BP 111/73   Pulse 84   Temp 98.8 F (37.1 C)   Resp 18   LMP 12/10/2016   SpO2 100%   Visual Acuity Right Eye Distance:   Left Eye Distance:   Bilateral Distance:    Right Eye Near:   Left Eye Near:    Bilateral Near:     Physical Exam  Constitutional:  She is oriented to person, place, and time. She appears well-developed and well-nourished. No distress.  HENT:  Head: Normocephalic and atraumatic.  Eyes: EOM are normal. Pupils are equal, round, and reactive to light.  Neck: Normal range of motion.  Cardiovascular: Normal rate and regular rhythm.  Pulmonary/Chest: Effort normal and breath sounds normal. No respiratory distress.  Abdominal: Soft. Bowel sounds are normal. She exhibits no distension and no mass. There is tenderness (Epigastric and midabdomen). There is no rebound and no guarding.  Musculoskeletal: Normal range of motion.  Neurological: She is alert and oriented to person, place, and time.  Skin: Skin is warm and dry.  Psychiatric: She has a normal mood and affect. Her behavior is normal.     UC Treatments / Results  Labs (all labs ordered are listed, but only abnormal results are displayed) Labs Reviewed  POCT PREGNANCY, URINE  POCT I-STAT, CHEM 8  Urine pregnancy test negative  EKG  EKG Interpretation None       Radiology No results found.  Procedures Procedures (including critical care time)  Medications Ordered in UC Medications - No data to display   Initial Impression / Assessment and Plan / UC Course  I have reviewed the triage vital signs and the nursing notes.  Pertinent labs & imaging results that were available during my care of the patient were reviewed by me and considered in my medical decision making (see chart for details).       Final Clinical Impressions(s) / UC Diagnoses   Final diagnoses:  Nausea  Non-intractable vomiting with nausea, unspecified vomiting type   Advised patient that I recommend taking the medications as prescribed below.  [facility]  first thing tomorrow morning to schedule appointment to be seen as soon as possible.  If she has trouble getting into their office also recommend that she contact her OB/GYN physician Dr. Gerald Leitzara Cole to  see if she can help assist with that since she does not have a primary care physician. If symptoms worsen over the next couple of days she should go to the emergency room for further evaluation. All questions answered. New Prescriptions This SmartLink is deprecated. Use AVSMEDLIST instead to display the medication list for a patient. Meds ordered this encounter  Medications  . pantoprazole (PROTONIX) 40 MG tablet    Sig: Take 1 tablet (40 mg total) 2 (two) times daily before a meal by mouth.    Dispense:  30 tablet  Refill:  0    Order Specific Question:   Supervising Provider    Answer:   Eustace Moore [[id number]]  . ondansetron (ZOFRAN ODT) 4 MG disintegrating tablet    Sig: Take 1 tablet (4 mg total) 2 (two) times daily as needed by mouth for nausea or vomiting.    Dispense:  20 tablet    Refill:  0    Order Specific Question:   Supervising Provider    Answer:   Eustace Moore [[id number]]    Controlled Substance Prescriptions [place] Controlled Substance Registry consulted? Not Applicable   Naida Sleight, Cordelia Poche 12/10/16 2011

## 2016-12-10 NOTE — ED Triage Notes (Signed)
Pt c/o nausea vomiting x3 months. Pt been having periods. [facility]

## 2016-12-10 NOTE — Discharge Instructions (Signed)
Take the medications as prescribed this evening.  Recommend that you contact [facility] first thing tomorrow morning to schedule appointment to be seen soon as possible.  if symptoms worsen over the next couple days you should go immediately to the emergency room for further evaluation.

## 2016-12-13 ENCOUNTER — Other Ambulatory Visit: Payer: Self-pay | Admitting: Gastroenterology

## 2016-12-13 ENCOUNTER — Encounter (INDEPENDENT_AMBULATORY_CARE_PROVIDER_SITE_OTHER): Payer: Self-pay | Admitting: Family

## 2016-12-13 ENCOUNTER — Telehealth: Payer: Self-pay | Admitting: Family

## 2016-12-13 DIAGNOSIS — R1084 Generalized abdominal pain: Secondary | ICD-10-CM

## 2016-12-13 DIAGNOSIS — R112 Nausea with vomiting, unspecified: Secondary | ICD-10-CM

## 2016-12-13 DIAGNOSIS — G43001 Migraine without aura, not intractable, with status migrainosus: Secondary | ICD-10-CM

## 2016-12-13 MED ORDER — QUDEXY XR 25 MG PO CS24: EXTENDED_RELEASE_CAPSULE | ORAL | 0 refills | Status: DC

## 2016-12-13 NOTE — Telephone Encounter (Signed)
I called and talked to Anna Fitzpatrick. She repeated the information below about daily nausea and vomiting, and that she had been [facility]. She said that she was experiencing frequent migraines because of frequent vomiting and asked if she could try migraine preventative again. I talked with her about preventatives that she had tried in the past and she asked to try Qudexy again. I talked with her about the need to be well hydrated while taking this medication and that she should avoid getting pregnant while taking it. I asked her to schedule a follow up appointment within the next few weeks and to follow up with her gastroenterologist about the vomiting. She asked for a note for missing work today due to migraine and I told her that I would put sample of Qudexy as well as note for work at front desk. TG

## 2016-12-13 NOTE — Telephone Encounter (Signed)
°  Who's calling (name and relationship to patient) : Self Best contact number: [phone] Provider they see: Sula Sodaina G. - NP Reason for call: Pt called in stating that she is not doing well, she had to be seen at the Urgent Care on Sunday, you have a migraine today, she has been vomiting/nauceous for the past 1581mo's. Pt has been seen by [facility] and they did not find anything at this time. Pt would like a call back as soon as possible, pt is at home.

## 2016-12-13 NOTE — Telephone Encounter (Signed)
I reviewed your note and agree with this plan. 

## 2016-12-13 NOTE — Telephone Encounter (Signed)
Patient states that at the beginning of August, she started vomiting and nauseous all the time. EEG was done and it came back normal. Not associated with migraines. Thought she was pregnant but test was negative when she went to the OB-GYN. Vibrations started again and she started vomiting again. [facility]gent [facility] and they could not find the reason she is vomiting. She states that [facility] wanted to send her to another specialist but she can not afford to pay out of pocket. She states that the vomiting has gotten worse over the last two weeks. She just needs to know what to do

## 2016-12-18 ENCOUNTER — Ambulatory Visit (INDEPENDENT_AMBULATORY_CARE_PROVIDER_SITE_OTHER): Payer: BLUE CROSS/BLUE SHIELD | Admitting: Family

## 2016-12-18 ENCOUNTER — Other Ambulatory Visit: Payer: Self-pay | Admitting: Obstetrics and Gynecology

## 2016-12-18 DIAGNOSIS — R102 Pelvic and perineal pain: Secondary | ICD-10-CM

## 2016-12-18 NOTE — Progress Notes (Deleted)
Patient: Anna Fitzpatrick MRN: [id number] Sex: female DOB: [birth year]  Provider: Elveria Risingina Goodpasture, NP Location [facility] Care[facility]  Note type: Routine return visit  History of Present Illness: History from: {CN REFERRED [id number]}[id number]} Chief Complaint: Migraine without aura and with status migrainosus, not intractable  Anna Fitzpatrick is a 21 y.o. with history of   Neither nor  mother have other health concerns for   today other than previously mentioned.  Review of Systems: Please see the HPI for neurologic and other pertinent review of systems. Otherwise, all other systems were reviewed and were negative.    Past Medical History:  Diagnosis Date  . Asthma   . Headache(784.0)    Hospitalizations: {yes no:314532}, Head Injury: {yes no:314532}, Nervous System Infections: {yes no:314532}, Immunizations up to date: {yes no:314532} Past Medical History Comments: ***.   Surgical History Past Surgical History:  Procedure Laterality Date  . adenoids   2004   Removed  . ear tubes removed  2002 and 2009  . EEG ADULT  10/14/2016      . TYMPANOSTOMY TUBE PLACEMENT  1999    Family History family history is not on file. Family History is otherwise negative for migraines, seizures, cognitive impairment, blindness, deafness, birth defects, chromosomal disorder, autism.  Social History Social History   Socioeconomic History  . Marital status: Single    Spouse name: Not on file  . Number of children: 0  . Years of education: college  . Highest education level: Not on file  Social Needs  . Financial resource strain: Not on file  . Food insecurity - worry: Not on file  . Food insecurity - inability: Not on file  . Transportation needs - medical: Not on file  . Transportation needs - non-medical: Not on file  Occupational History  . Occupation: Consulting civil engineertudent  Tobacco Use  . Smoking status: Never Smoker  . Smokeless tobacco: Never Used  Substance and Sexual  Activity  . Alcohol use: No    Alcohol/week: 0.0 oz  . Drug use: No  . Sexual activity: Not Currently    Partners: Male    Birth control/protection: None  Other Topics Concern  . Not on file  Social History Narrative   Anna Fitzpatrick is employed at [employer] in [place]. Lives with mom Anna Fitzpatrick enjoys reading and working with kids.    Allergies Allergies  Allergen Reactions  . Zithromax [Azithromycin] Hives and Rash    Physical Exam LMP 12/10/2016  ***  Impression 1. 2. 3.   Recommendations for plan of care The patient's previous [facility] records were reviewed. Anna Fitzpatrick has neither had nor required imaging or lab studies since the last visit.   The medication list was reviewed and reconciled.  No changes were made in the prescribed medications today.  A complete medication list was provided to the patient/caregiver.  Allergies as of 12/18/2016      Reactions   Zithromax [azithromycin] Hives, Rash      Medication List        Accurate as of 12/18/16  7:52 AM. Always use your most recent med list.          aspirin-acetaminophen-caffeine 250-250-65 MG tablet Commonly known as:  EXCEDRIN MIGRAINE Take 2 tablets at onset of migraine. May repeat in 4-6 hours if needed.   ondansetron 4 MG disintegrating tablet Commonly known as:  ZOFRAN ODT Take 1 tablet (4 mg total) 2 (two) times daily as needed by mouth for nausea or vomiting.  pantoprazole 40 MG tablet Commonly known as:  PROTONIX Take 1 tablet (40 mg total) 2 (two) times daily before a meal by mouth.   QUDEXY XR 25 MG Cs24 sprinkle cap Generic drug:  Topiramate ER Take 1 at bedtime       Dr. Sharene SkeansHickling was consulted regarding the patient.   Total time spent with the patient was ***  minutes, of which 50% or more was spent in counseling and coordination of care.

## 2016-12-22 ENCOUNTER — Ambulatory Visit
Admission: RE | Admit: 2016-12-22 | Discharge: 2016-12-22 | Disposition: A | Discharge status: Home / Self Care | Payer: BLUE CROSS/BLUE SHIELD | Source: Ambulatory Visit | Attending: Obstetrics and Gynecology | Admitting: Obstetrics and Gynecology

## 2016-12-22 DIAGNOSIS — R102 Pelvic and perineal pain: Secondary | ICD-10-CM

## 2017-01-17 ENCOUNTER — Emergency Department (HOSPITAL_COMMUNITY)
Admission: EM | Admit: 2017-01-17 | Discharge: 2017-01-18 | Disposition: A | Discharge status: Home / Self Care | Payer: BLUE CROSS/BLUE SHIELD | Attending: Emergency Medicine | Admitting: Emergency Medicine

## 2017-01-17 ENCOUNTER — Encounter (HOSPITAL_COMMUNITY): Payer: Self-pay

## 2017-01-17 DIAGNOSIS — J45909 Unspecified asthma, uncomplicated: Secondary | ICD-10-CM | POA: Insufficient documentation

## 2017-01-17 DIAGNOSIS — Z79899 Other long term (current) drug therapy: Secondary | ICD-10-CM | POA: Insufficient documentation

## 2017-01-17 DIAGNOSIS — H5712 Ocular pain, left eye: Secondary | ICD-10-CM | POA: Insufficient documentation

## 2017-01-17 MED ORDER — FLUORESCEIN SODIUM 1 MG OP STRP
1.0000 | ORAL_STRIP | Freq: Once | OPHTHALMIC | Status: AC
  Administered 2017-01-17: 1 via OPHTHALMIC
  Filled 2017-01-17: qty 1

## 2017-01-17 MED ORDER — TETRACAINE HCL 0.5 % OP SOLN
2.0000 [drp] | Freq: Once | OPHTHALMIC | Status: AC
  Administered 2017-01-17: 2 [drp] via OPHTHALMIC
  Filled 2017-01-17: qty 4

## 2017-01-17 NOTE — ED Triage Notes (Signed)
Pt states that she is having pain in her R eye, pt removed her contacts and is still having pain. C/o of blurry vision, pain and watering in that eye. Neuro intact, no redness noted

## 2017-01-18 ENCOUNTER — Emergency Department (HOSPITAL_COMMUNITY): Payer: BLUE CROSS/BLUE SHIELD

## 2017-01-18 LAB — I-STAT CHEM 8, ED
BUN: 11 mg/dL (ref 6–20)
Calcium, Ion: 1.23 mmol/L (ref 1.15–1.40)
Chloride: 105 mmol/L (ref 101–111)
Creatinine, Ser: 0.8 mg/dL (ref 0.44–1.00)
GLUCOSE: 98 mg/dL (ref 65–99)
HCT: 40 % (ref 36.0–46.0)
HEMOGLOBIN: 13.6 g/dL (ref 12.0–15.0)
POTASSIUM: 4.1 mmol/L (ref 3.5–5.1)
Sodium: 141 mmol/L (ref 135–145)
TCO2: 27 mmol/L (ref 22–32)

## 2017-01-18 MED ORDER — CIPROFLOXACIN HCL 0.3 % OP SOLN: 1.0000 [drp] | OPHTHALMIC | 0 refills | Status: DC

## 2017-01-18 MED ORDER — MORPHINE SULFATE (PF) 4 MG/ML IV SOLN
2.0000 mg | Freq: Once | INTRAVENOUS | Status: AC
  Administered 2017-01-18: 2 mg via INTRAVENOUS
  Filled 2017-01-18: qty 1

## 2017-01-18 MED ORDER — IOPAMIDOL (ISOVUE-300) INJECTION 61%
INTRAVENOUS | Status: AC
  Administered 2017-01-18: 75 mL
  Filled 2017-01-18: qty 75

## 2017-01-18 MED ORDER — ONDANSETRON HCL 4 MG/2ML IJ SOLN
4.0000 mg | Freq: Once | INTRAMUSCULAR | Status: AC
  Administered 2017-01-18: 4 mg via INTRAVENOUS
  Filled 2017-01-18: qty 2

## 2017-01-18 NOTE — ED Provider Notes (Signed)
CT is negative, will treat for conjunctivitis.  Recommend close follow-up with ophthalmology if no improvement.  Return here if symptoms worsen.  Patient understands and agrees the plan.  She is stable and ready for discharge.   Roxy HorsemanBrowning, Lajuanda Penick, PA-C 01/18/17 0255    Dione BoozeGlick, David, MD 01/18/17 [phone]

## 2017-01-18 NOTE — ED Provider Notes (Signed)
[facility] EMERGENCY DEPARTMENT Provider Note   CSN: [id number] Arrival date & time: 01/17/17  2151     History   Chief Complaint Chief Complaint  Patient presents with  . Eye Pain    HPI Anna Fitzpatrick is a 21 y.o. female with a history of chronic headaches and asthma who presents to the emergency department with a chief complaint of sudden onset, constant, worsening sharp right eye pain that began tonight with associated blurred vision and watery discharge while she was at work.  No recent trauma or injury to the eye.  She also reports a fever of 101 at home prior to arrival.  She denies chills, double vision, headache, dizziness, lightheadedness, or loss of vision.  No FB sensation. She reports the pain is aggravated with certain movements of the eye and alleviated by nothing.  She reports that she wears contact lenses that she sleeps in overnight and changes out for a new pair monthly.  In the past when she has had irritation in the eye, she has been able to relieve the pain after taking out her contacts.  No improvement with pain after removing her contacts tonight.   She is followed by Dr. Sharene Skeans with pediatric neurology. A review of her medical record indicates that she has tension headaches, migraine without aura, ice pick headaches, insomnia, and history of syncopal episodes.  She was most recently evaluated in September for episodes of vibration in her head.  An EEG was performed at that time, which was normal.  She was last started on a starter pack of Qudexy for her headaches, but reports that she ran out several weeks ago.  She was also seen by [facility] in November for daily nausea and vomiting.   She is a nonsmoker.   The history is provided by the patient. No language interpreter was used.    Past Medical History:  Diagnosis Date  . Asthma   . UUVOZDGU(440.3)     Patient Active Problem List   Diagnosis Date Noted  . Syncope 01/21/2016  .  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 History of hypotension 11/20/2013  . Migraine without aura 08/07/2012  . Episodic tension type headache 08/07/2012    Past Surgical History:  Procedure Laterality Date  . adenoids   2004   Removed  . ear tubes removed  2002 and 2009  . EEG ADULT  10/14/2016      . TYMPANOSTOMY TUBE PLACEMENT  1999    OB History    No data available       Home Medications    Prior to Admission medications   Medication Sig Start Date End Date Taking? Authorizing Provider  aspirin-acetaminophen-caffeine (EXCEDRIN MIGRAINE) [phone] MG tablet Take 2 tablets at onset of migraine. May repeat in 4-6 hours if needed. 05/14/15   Elveria Rising, NP  ciprofloxacin (CILOXAN) 0.3 % ophthalmic solution Place 1 drop into both eyes every 2 (two) hours. Administer 1 drop, every 2 hours, while awake, for 2 days. Then 1 drop, every 4 hours, while awake, for the next 5 days. 01/18/17   Roxy Horseman, PA-C  ondansetron (ZOFRAN ODT) 4 MG disintegrating tablet Take 1 tablet (4 mg total) 2 (two) times daily as needed by mouth for nausea or vomiting. 12/10/16   Naida Sleight, PA-C  pantoprazole (PROTONIX) 40 MG tablet Take 1 tablet (40 mg total) 2 (two) times daily before a meal by mouth. 12/10/16   Zonia Kief  M, PA-C  QUDEXY XR 25 MG CS24 sprinkle cap Take 1 at bedtime 12/13/16   Elveria RisingGoodpasture, [name], NP    Family History No family history on file.  Social History Social History   Tobacco Use  . Smoking status: Never Smoker  . Smokeless tobacco: Never Used  Substance Use Topics  . Alcohol use: No    Alcohol/week: 0.0 oz  . Drug use: No     Allergies   Zithromax [azithromycin]   Review of Systems Review of Systems  Constitutional: Positive for fever. Negative for chills.  HENT: Negative for facial swelling.   Eyes: Positive for photophobia, pain, discharge (watery), redness and visual disturbance (blurred vision). Negative for itching.    Gastrointestinal: Positive for nausea. Negative for vomiting.  Skin: Negative for color change.  Neurological: Negative for dizziness, syncope, weakness, light-headedness and headaches.     Physical Exam Updated Vital Signs BP 108/68 (BP Location: Left Arm)   Pulse 64   Temp 99.1 F (37.3 C) (Oral)   Resp 18   Ht 5\' 1"  (1.549 m)   Wt 68 kg (150 lb)   LMP 01/04/2017   SpO2 100%   BMI 28.34 kg/m   Physical Exam  Constitutional: No distress.  Appears uncomfortable.  HENT:  Head: Normocephalic.  Eyes: Lids are normal. Pupils are equal, round, and reactive to light. Lids are everted and swept, no foreign bodies found. Right eye exhibits discharge (watery). Right eye exhibits no exudate and no hordeolum. No foreign body present in the right eye. Left eye exhibits no hordeolum. Right conjunctiva is injected. Right conjunctiva has no hemorrhage. Left conjunctiva is not injected. Left conjunctiva has no hemorrhage. No scleral icterus. Right eye exhibits abnormal extraocular motion. Right eye exhibits no nystagmus. Left eye exhibits normal extraocular motion and no nystagmus.  Slit lamp exam:      The right eye shows no corneal abrasion, no foreign body, no hyphema, no hypopyon, no fluorescein uptake and no anterior chamber bulge.       The left eye shows no corneal flare, no corneal ulcer, no foreign body, no hyphema and no hypopyon.  EOC are intact, but she has increased pain and grimacing with looking up to the right (superior rectus), up to the left (inferior oblique), and to the left (medial rectus), CN III. On slit lamp exam, there is a 2mm white, area that appears squarish in shape, around 12 o'clock on the right cornea that is not observed on the left cornea.  Neck: Normal range of motion. Neck supple.  Cardiovascular: Normal rate and regular rhythm. Exam reveals no gallop and no friction rub.  No murmur heard. Pulmonary/Chest: Effort normal. No respiratory distress.  Abdominal:  Soft. She exhibits no distension.  Neurological: She is alert.  Skin: Skin is warm. No rash noted.  Psychiatric: Her behavior is normal.  Nursing note and vitals reviewed.    ED Treatments / Results  Labs (all labs ordered are listed, but only abnormal results are displayed) Labs Reviewed  I-STAT CHEM 8, ED    EKG  EKG Interpretation None       Radiology Ct Orbits W Contrast  Result Date: 01/18/2017 CLINICAL DATA:  Initial evaluation for acute pain with blurry vision in right eye. EXAM: CT ORBITS WITH CONTRAST TECHNIQUE: Multidetector CT images was performed according to the standard protocol following intravenous contrast administration. CONTRAST:  75mL ISOVUE-300 IOPAMIDOL (ISOVUE-300) INJECTION 61% COMPARISON:  None. FINDINGS: Orbits: Globes are equal in size with normal appearance and  morphology bilaterally. Optic nerves symmetric and within normal limits. Extra-ocular muscles normal in appearance bilaterally. Superior orbital veins within normal limits. Intraconal and extraconal fat well-maintained. Lacrimal glands normal. Visualized sinuses: Mild scattered mucoperiosteal thickening within the visualized paranasal sinuses. No air-fluid level to suggest acute sinusitis. Visualize mastoids and middle ear cavities are clear. Soft tissues: No significant periorbital or preseptal soft tissue swelling. Remainder the visualized soft tissues of the face within normal limits. Limited intracranial: Unremarkable. IMPRESSION: Normal CT of the orbits.  No acute abnormality identified. Electronically Signed   By: Rise MuBenjamin  McClintock M.D.   On: 01/18/2017 02:42    Procedures Procedures (including critical care time)  Medications Ordered in ED Medications  tetracaine (PONTOCAINE) 0.5 % ophthalmic solution 2 drop (2 drops Right Eye Given 01/17/17 2343)  fluorescein ophthalmic strip 1 strip (1 strip Right Eye Given 01/17/17 2343)  morphine 4 MG/ML injection 2 mg (2 mg Intravenous Given  01/18/17 0223)  ondansetron (ZOFRAN) injection 4 mg (4 mg Intravenous Given 01/18/17 0223)  iopamidol (ISOVUE-300) 61 % injection (75 mLs  Contrast Given 01/18/17 0200)     Initial Impression / Assessment and Plan / ED Course  I have reviewed the triage vital signs and the nursing notes.  Pertinent labs & imaging results that were available during my care of the patient were reviewed by me and considered in my medical decision making (see chart for details).     21 year old female with history of chronic headaches followed by pediatric neurology presenting with right eye pain, redness, fever, and blurred vision. Visual acuity: 20/64 OU, 20/100 OD, 20/50 OS.  No improvement of pain after tetracaine application.  On physical exam, the right eye is injected. No exudate or hordeolum noted. Extraocular movements are intact and pupils are equal round and reactive.  Increased pain and wincing with looking up and to the right, up and to the left, and to the left (CN III).  On slit-lamp exam, there is an area of approximately 2 mm around the 12 o'clock region that appears white in color. Given  Fever and pain with EOM, concern for orbital cellulitis, CT Orbits ordered, which is pending. Istat chem 8 is unremarkable. Plan and disposition pending CT results. [facility] the end of my shift. Patient presentation, ED course, and plan of care discussed with review of all pertinent labs and imaging. Please see his/her note for further details regarding further ED course and disposition.   Final Clinical Impressions(s) / ED Diagnoses   Final diagnoses:  Pain of left eye    ED Discharge Orders        Ordered    ciprofloxacin (CILOXAN) 0.3 % ophthalmic solution  Every 2 hours     01/18/17 0247       Barkley BoardsMcDonald, Eufelia Veno A, PA-C 01/18/17 0248    Dione BoozeGlick, David, MD 01/18/17 [phone]

## 2017-01-18 NOTE — ED Notes (Signed)
Patient transported to CT 

## 2017-02-19 ENCOUNTER — Encounter (INDEPENDENT_AMBULATORY_CARE_PROVIDER_SITE_OTHER): Payer: Self-pay | Admitting: Family

## 2017-02-20 ENCOUNTER — Telehealth (INDEPENDENT_AMBULATORY_CARE_PROVIDER_SITE_OTHER): Payer: Self-pay | Admitting: Family

## 2017-02-20 DIAGNOSIS — G43001 Migraine without aura, not intractable, with status migrainosus: Secondary | ICD-10-CM

## 2017-02-20 MED ORDER — TIZANIDINE HCL 4 MG PO TABS: ORAL_TABLET | ORAL | 0 refills | Status: DC

## 2017-02-20 MED ORDER — PROMETHAZINE HCL 12.5 MG PO TABS: 12.5000 mg | ORAL_TABLET | Freq: Four times a day (QID) | ORAL | 0 refills | Status: DC | PRN

## 2017-02-20 NOTE — Telephone Encounter (Signed)
I agree with your recommendations, the medications that you prescribed in the recommendation for her to go to emergency room.  I understand the difficulty that she is in.  I guess that she is still a dependent of her parents and therefore is unable to apply for Medicaid.

## 2017-02-20 NOTE — Telephone Encounter (Signed)
°  Who's calling (name and relationship to patient) : Self  Best contact number: [phone]  Provider they see: Sula Sodaina G.  Reason for call: Pt stated that she has had a migraine since Friday, completely out of meds; over the counter meds are not helping, pt requests a call back as soon as possible please.

## 2017-02-20 NOTE — Telephone Encounter (Signed)
I called and talked with Odelle. She said that she developed a migraine on Friday Jan 11th and that she has been experiencing severe pain, the vibration sensation that she has described in the past, and vomiting. She has tried Excedrin Migraine and Ondansetron without relief. She asked for Rx for Tizanidine to be called in which I will do and I will also send in Rx for Phenergan to see if that will help with vomiting. I reminded her of the need to drink fluids and told her that she should not drive while taking these medications because they cause sleepiness. I also talked with [name] about going to ER because of prolonged migraine and she said that she did not have insurance and could not afford ER visit. I explained to her that we are very limited in oral treatment options at this point with a the prolonged migraine and she verbalized understanding. TG

## 2017-02-20 NOTE — Telephone Encounter (Signed)
Spoke with Anna Fitzpatrick and she expressed having this migraine since Friday. She states that she is out of the samples that [name] gave her and she just needs something that will take the migraine away. I informed her that [name] is in clinic but will give her a call back.

## 2017-08-09 ENCOUNTER — Encounter (INDEPENDENT_AMBULATORY_CARE_PROVIDER_SITE_OTHER): Payer: Self-pay | Admitting: Family

## 2017-08-09 DIAGNOSIS — G43001 Migraine without aura, not intractable, with status migrainosus: Secondary | ICD-10-CM

## 2017-08-10 ENCOUNTER — Encounter (INDEPENDENT_AMBULATORY_CARE_PROVIDER_SITE_OTHER): Payer: Self-pay | Admitting: Family

## 2017-08-10 MED ORDER — PROMETHAZINE HCL 12.5 MG PO TABS: 12.5000 mg | ORAL_TABLET | Freq: Four times a day (QID) | ORAL | 0 refills | Status: DC | PRN

## 2017-08-10 MED ORDER — TIZANIDINE HCL 4 MG PO TABS: ORAL_TABLET | ORAL | 0 refills | Status: DC

## 2017-08-10 NOTE — Telephone Encounter (Signed)
I sent in refills for Tizanidine and Promethazine. TG

## 2017-10-31 ENCOUNTER — Other Ambulatory Visit: Payer: Self-pay | Admitting: Occupational Medicine

## 2017-10-31 ENCOUNTER — Ambulatory Visit: Payer: Self-pay

## 2017-10-31 DIAGNOSIS — M25511 Pain in right shoulder: Secondary | ICD-10-CM

## 2018-01-05 IMAGING — CT CT ORBITS W/ CM
3 of 6 series · 13 of 47 positions shown, 15 images · IV contrast (APPLIED)
Comparison: None.

CLINICAL DATA: Initial evaluation for acute pain with blurry vision
in right eye.

EXAM:
CT ORBITS WITH CONTRAST
TECHNIQUE: Multidetector CT images was performed according to the standard
protocol following intravenous contrast administration.
CONTRAST:  75mL [id number] IOPAMIDOL ([id number]) INJECTION 61%

[Series 3: orbits 2.0 hr40 3 st · axial · 0.31mm/px · z∈[+1136,+1210]mm · 8 of 45 slices shown, 10 images]
[im 4/45  brain]
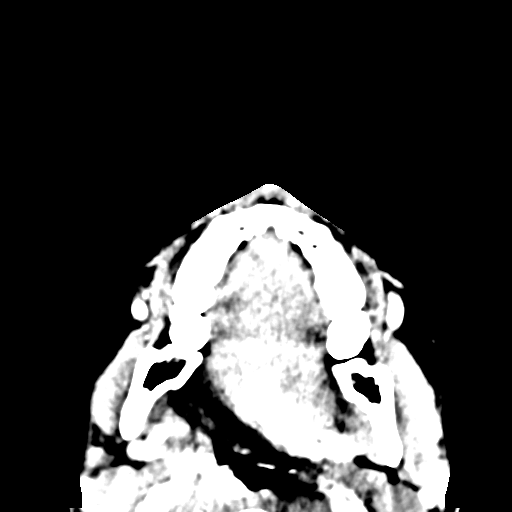
[im 4/45  bone]
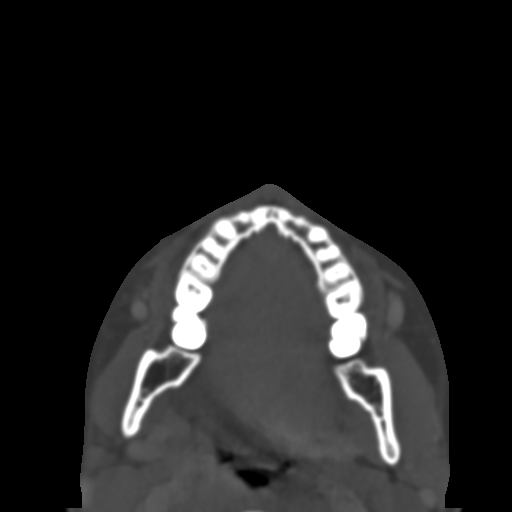
[im 10/45  bone]
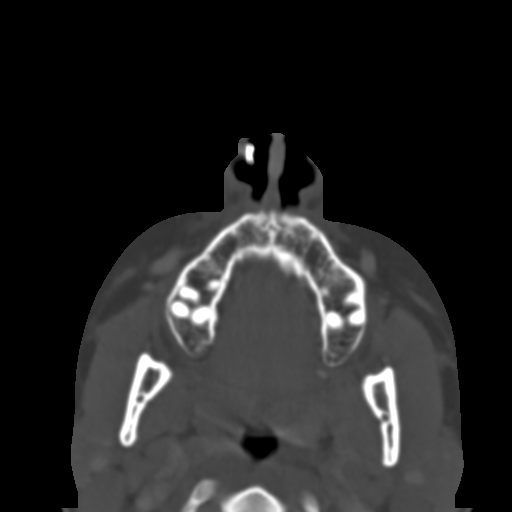
[im 16/45  bone]
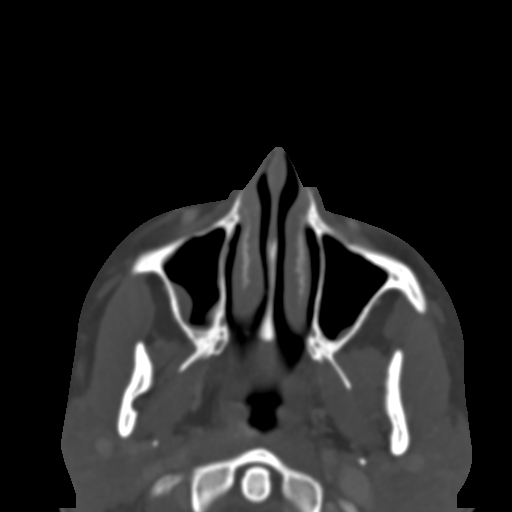
[im 19/45  bone]
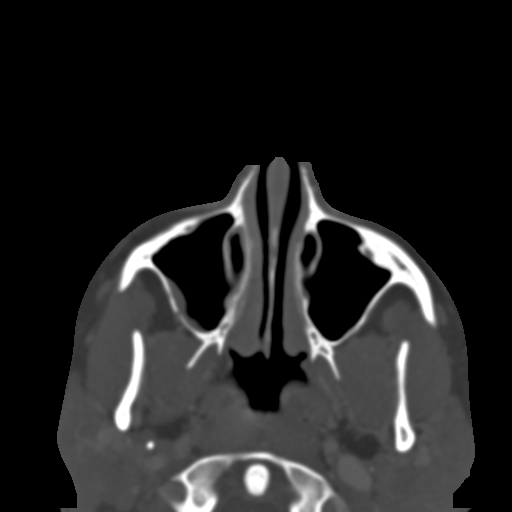
[im 26/45  brain]
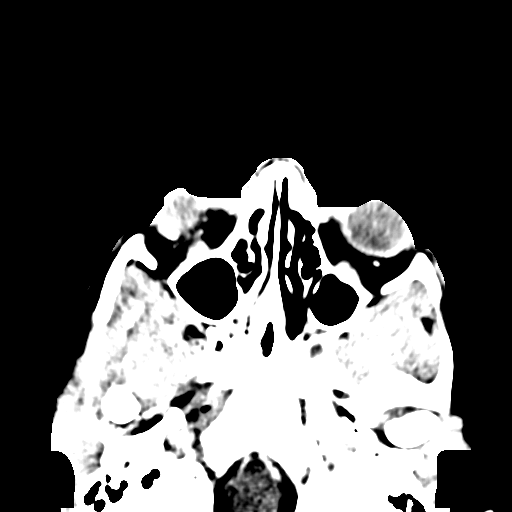
[im 26/45  bone]
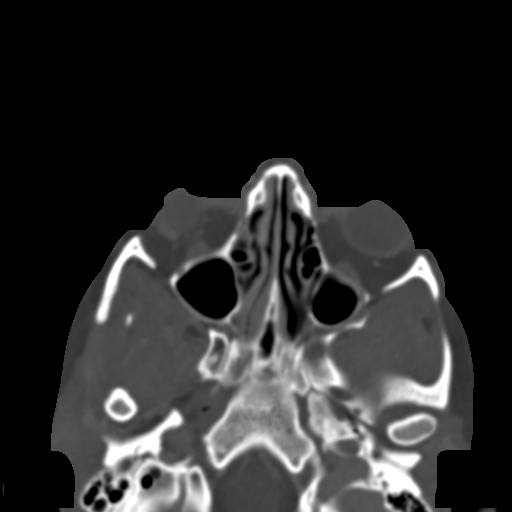
[im 29/45  bone]
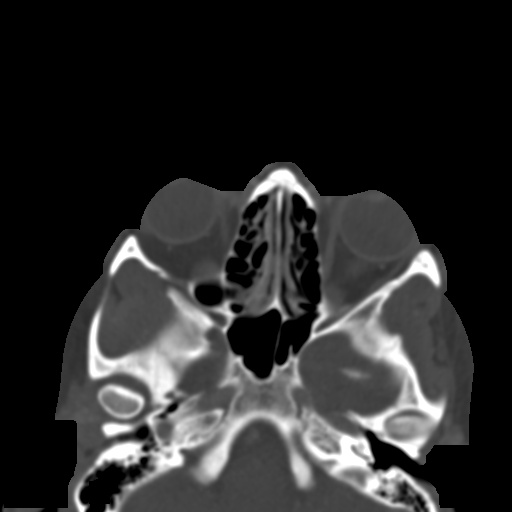
[im 35/45  bone]
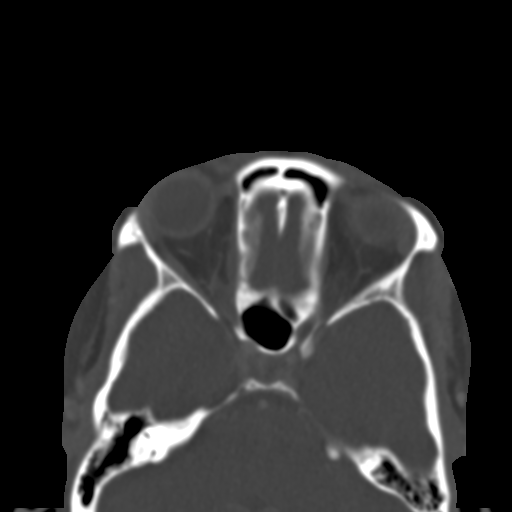
[im 41/45  bone]
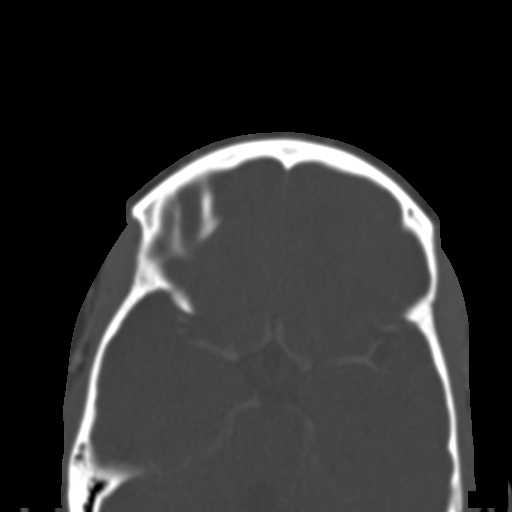

[Series 7: bone cor · coronal · 0.20mm/px · 3 of 71 slices shown]
[im 18/71  bone]
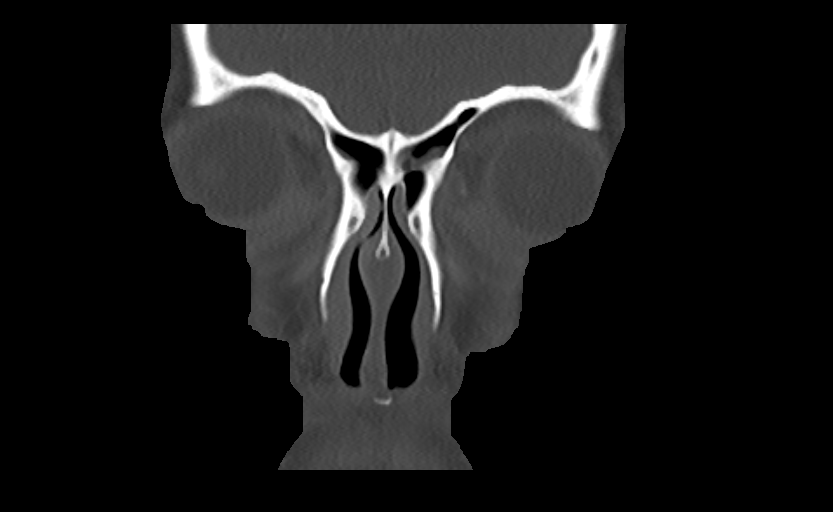
[im 36/71  bone]
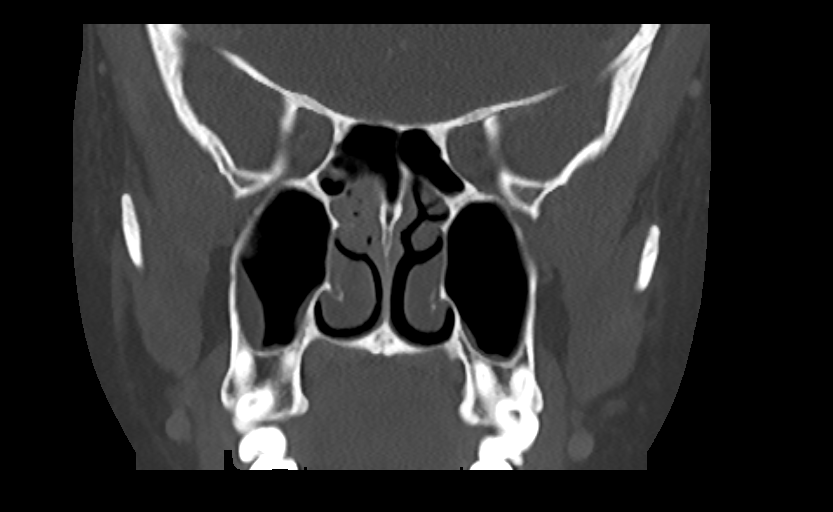
[im 53/71  bone]
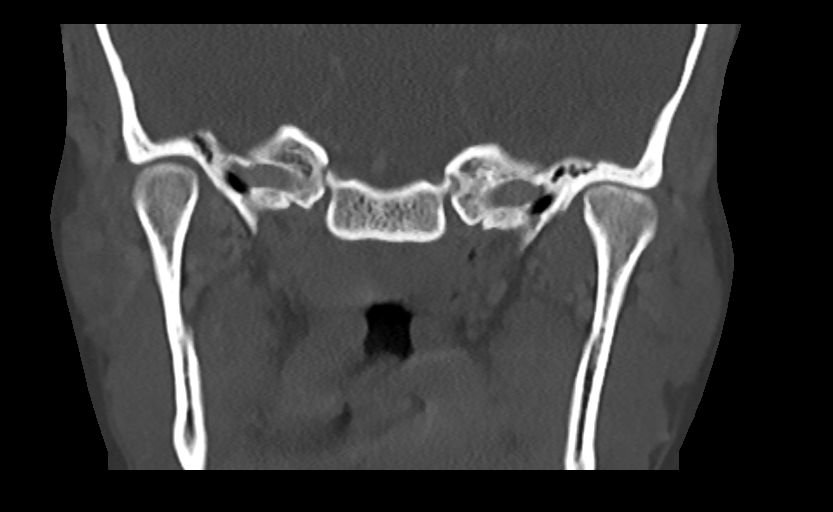

[Series 8: bone sag · sagittal · 0.17mm/px · 2 of 76 slices shown]
[im 26/76  bone]
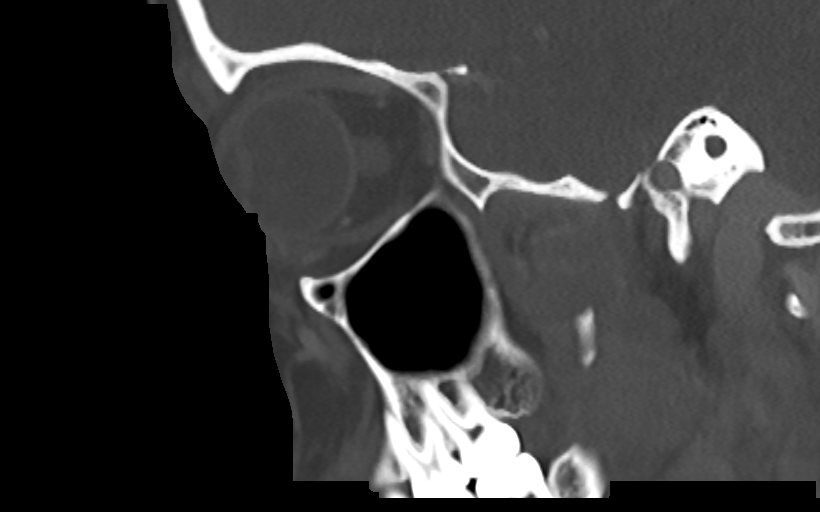
[im 51/76  bone]
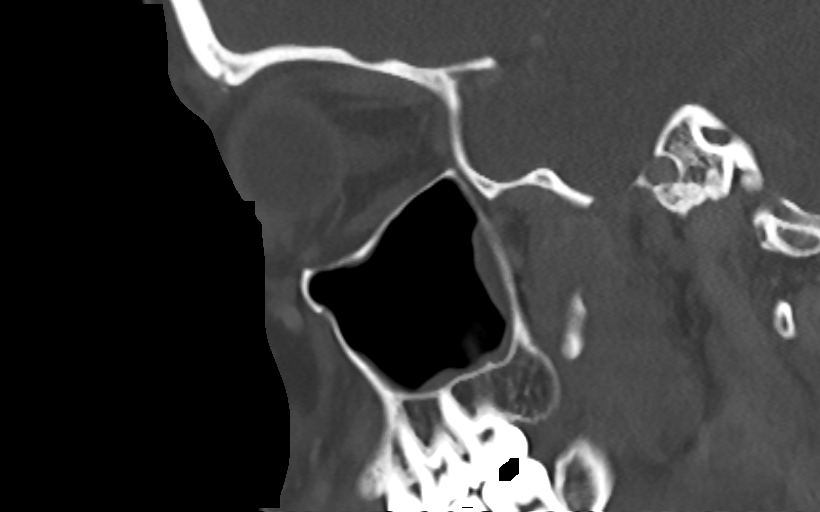

[13 of 47 positions shown; findings below may reference images not displayed]

FINDINGS: Orbits: Globes are equal in size with normal appearance and
morphology bilaterally. Optic nerves symmetric and within normal
limits. Extra-ocular muscles normal in appearance bilaterally.
Superior orbital veins within normal limits. Intraconal and
extraconal fat well-maintained. Lacrimal glands normal.

Visualized sinuses: Mild scattered mucoperiosteal thickening within
the visualized paranasal sinuses. No air-fluid level to suggest
acute sinusitis. Visualize mastoids and middle ear cavities are
clear.

Soft tissues: No significant periorbital or preseptal soft tissue
swelling. Remainder the visualized soft tissues of the face within
normal limits.

Limited intracranial: Unremarkable.
IMPRESSION: Normal CT of the orbits.  No acute abnormality identified.

## 2018-02-27 ENCOUNTER — Emergency Department: Payer: No Typology Code available for payment source

## 2018-02-27 ENCOUNTER — Emergency Department
Admission: EM | Admit: 2018-02-27 | Discharge: 2018-02-27 | Disposition: A | Discharge status: Home / Self Care | Payer: No Typology Code available for payment source | Attending: Emergency Medicine | Admitting: Emergency Medicine

## 2018-02-27 ENCOUNTER — Encounter: Payer: Self-pay | Admitting: Emergency Medicine

## 2018-02-27 DIAGNOSIS — Z79899 Other long term (current) drug therapy: Secondary | ICD-10-CM | POA: Insufficient documentation

## 2018-02-27 DIAGNOSIS — Y92410 Unspecified street and highway as the place of occurrence of the external cause: Secondary | ICD-10-CM | POA: Insufficient documentation

## 2018-02-27 DIAGNOSIS — S20219A Contusion of unspecified front wall of thorax, initial encounter: Secondary | ICD-10-CM | POA: Diagnosis not present

## 2018-02-27 DIAGNOSIS — J45909 Unspecified asthma, uncomplicated: Secondary | ICD-10-CM | POA: Insufficient documentation

## 2018-02-27 DIAGNOSIS — Y999 Unspecified external cause status: Secondary | ICD-10-CM | POA: Insufficient documentation

## 2018-02-27 DIAGNOSIS — S299XXA Unspecified injury of thorax, initial encounter: Secondary | ICD-10-CM | POA: Diagnosis present

## 2018-02-27 DIAGNOSIS — Y9389 Activity, other specified: Secondary | ICD-10-CM | POA: Insufficient documentation

## 2018-02-27 MED ORDER — CYCLOBENZAPRINE HCL 5 MG PO TABS: 5.0000 mg | ORAL_TABLET | Freq: Three times a day (TID) | ORAL | 0 refills | Status: DC | PRN

## 2018-02-27 MED ORDER — ASPIRIN-ACETAMINOPHEN-CAFFEINE 250-250-65 MG PO TABS
2.0000 | ORAL_TABLET | Freq: Once | ORAL | Status: AC
  Administered 2018-02-27: 2 via ORAL
  Filled 2018-02-27 (×2): qty 2
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
## 2018-02-27 NOTE — ED Notes (Signed)
Pt verbalizes d/c understanding and follow up. Pt in NAD, pt unable to sign esignautre due to computer malfnx
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
## 2018-03-24 ENCOUNTER — Telehealth: Payer: Self-pay | Admitting: Advanced Practice Midwife

## 2018-03-24 NOTE — Telephone Encounter (Signed)
Pt sees Dr. Richardson Dopp at [facility].  Anna Fitzpatrick (23 year old nulligravida) called the after hours line on Sunday morning to report severe pelvic pain 9 out of 10 on the pain scale new since this morning. She is at her job in [employer] and trying to figure out what to do.  She denies recent trauma, recent coitus, vaginal odor, fever, urinary sx such as urgency/frequency/burning. She has vaginal discharge, describes it as watery, colorless. She has no new sex partners. She reports negative STI testing since having sex. Her LMP was 03/13/18 lasting until 03/16/18 and she has not has intercourse since then. She reports this was a normal period for her, 3-5 days being typical. She believes there is no way she could be pregnant.  She went to Urgent Care, they said they have no ultrasound and could not treat her but they did do blood work and said they would call with any abnl results. She went to [facility], they said their pelvic US person would be coming in tonight ans there was a 6h wait to be seen.   Her mother has a hx of cysts and patient believes she is experiencing an ovarian cyst(s). We talked about other things that can cause pelvic pain such as PID and ectopic pregnancy. I offered patient to either come to [facility] or since she was 9 out of10 with her pain present to [facility] ED for tx which is 5 minutes from where she is in [place]. She said she would think about it and decide.  Jonetta Speak, CNM

## 2018-09-02 ENCOUNTER — Telehealth (INDEPENDENT_AMBULATORY_CARE_PROVIDER_SITE_OTHER): Payer: Self-pay | Admitting: Family

## 2018-09-02 NOTE — Telephone Encounter (Signed)
I called patient and let her know message from [place], she verbalized agreement and understanding.

## 2018-09-02 NOTE — Telephone Encounter (Signed)
Yes I will see her and will refill meds at that appointment.We can also talk about different referral to adult neurology practice at that time. TG

## 2018-09-02 NOTE — Telephone Encounter (Signed)
Who's calling (name and relationship to patient) : Anna Fitzpatrick (self)  Best contact number: [phone]  Provider they see: Rockwell [place]  Reason for call:  Anna Fitzpatrick called in stating that she was going to transfer to the headache wellness center, insurance would not cover it and then by the time they heard back form insurance they were not accepting new pts. [name] wants to know if tina would still be able to see her and if she could get more medications from her. States it is her nausea medication and "medication when I first start getting a headache". Unsure of name.  Writer did schedule but explained to Probation officer if anything different we would be contacting her. Please advise  Call ID:      PRESCRIPTION REFILL ONLY  Name of prescription: Zofran,   Pharmacy: [facility]

## 2018-09-05 ENCOUNTER — Ambulatory Visit (INDEPENDENT_AMBULATORY_CARE_PROVIDER_SITE_OTHER): Payer: Self-pay | Admitting: Family

## 2018-09-05 ENCOUNTER — Other Ambulatory Visit: Payer: Self-pay

## 2018-09-05 ENCOUNTER — Encounter (INDEPENDENT_AMBULATORY_CARE_PROVIDER_SITE_OTHER): Payer: Self-pay | Admitting: Family

## 2018-09-05 DIAGNOSIS — G43001 Migraine without aura, not intractable, with status migrainosus: Secondary | ICD-10-CM

## 2018-09-05 MED ORDER — PANTOPRAZOLE SODIUM 40 MG PO TBEC: 40.0000 mg | DELAYED_RELEASE_TABLET | Freq: Two times a day (BID) | ORAL | 0 refills | Status: DC

## 2018-09-05 MED ORDER — TIZANIDINE HCL 4 MG PO TABS: ORAL_TABLET | ORAL | 0 refills | Status: DC

## 2018-09-05 MED ORDER — SUMATRIPTAN SUCCINATE 25 MG PO TABS: 25.0000 mg | ORAL_TABLET | ORAL | 0 refills | Status: DC | PRN

## 2018-09-05 MED ORDER — TROKENDI XR 25 MG PO CP24: ORAL_CAPSULE | ORAL | 0 refills | Status: DC

## 2018-09-05 MED ORDER — ONDANSETRON 4 MG PO TBDP: ORAL_TABLET | ORAL | 0 refills | Status: DC

## 2018-09-05 NOTE — Patient Instructions (Addendum)
Thank you for coming in today. You have a condition called migraine without aura. This is a type of severe headache that occurs in a normal brain and often runs in families. Your examination was normal. To treat your migraines we will try the following - medications and lifestyle measures.    To reduce the frequency of the migraines, we will try a medication that is FDA approved to prevent migraines from occurring. This medication is Trokendi XR. To take it you will take 1 capsule at bedtime.    It is important that you drink plenty of water while taking this medication.   To treat your migraines when they occur I have prescribed the following medications: 1. Sumatriptan 25mg  - take 1 tablet along with Ibuprofen 400mg  (2 of the 200mg  tablets) or Excedrine Migraine 2 tablets -  at the onset of the migraine). If the migraines is still present in 2 hours, take 1 more tablet.  You should not take this medication more than 2 times in 1 week.  2.  Tizanidine 4mg  - this is to to be taken ONLY at bedtime if you have a headache as you are going to bed. When this happens - take 1 of the Tizanidine tablets along with Ibuprofen or Tylenol and go to bed.  3. Ondansetron ODT 4mg  - this is for nausea and should be taken at the onset of nausea. It can be repeated in 4-6 hours as needed.  For your nausea - I have prescribed Pantoprazole 40mg . Take 1 tablet in the morning and 1 tablet at night - about 12 hours apart. This is to help to get your stomach settled and nausea reduced.   There are some things that you can do that will help to minimize the frequency and severity of headaches. These are: 1. Get enough sleep and sleep in a regular pattern 2. Hydrate yourself well 3. Don't skip meals  4. Take breaks when working at a computer or playing video games 5. Exercise every day 6. Manage stress   You should be getting at least 8-9 hours of sleep each night. Bedtime should be a set time for going to bed and getting  up with few exceptions. Try to avoid napping during the day as this interrupts nighttime sleep patterns. If you need to nap during the day, it should be less than 45 minutes and should occur in the early afternoon.    You should be drinking 48-60oz of water per day, more on days when you exercise or are outside in summer heat. Try to avoid beverages with sugar and caffeine as they add empty calories, increase urine output and defeat the purpose of hydrating your body.    You should be eating 3 meals per day. If you are very active, you may need to also have a couple of snacks per day.    If you work at a computer or laptop, play games on a computer, tablet, phone or device such as a playstation or xbox, remember that this is continuous stimulation for your eyes. Take breaks at least every 30 minutes. Also there should be another light on in the room - never play in total darkness as that places too much strain on your eyes.    Exercise at least 20-30 minutes every day - not strenuous exercise but something like walking, stretching, etc.    Keep a headache diary and bring it with you when you come back for your next visit.  Please sign up for MyChart if you have not done so.   Please plan to return for follow up in 4 weeks or sooner if needed.

## 2018-09-05 NOTE — Progress Notes (Signed)
Anna Fitzpatrick   MRN:  [id number]  [birth year]   Provider: Elveria Risingina Keryl Gholson NP-C [facility]  Visit type: Routine Follow-UP  Last visit: 10/11/2016  Referral source: Blair Heysobert Ehinger, MD History from: [facility]  Brief history:  History of headaches that began in 2010. She has tension headaches, migraine without aura, ice pick headaches, insomnia and history of tension headaches.   Today's concerns: Anna DadaZarria tells me today that she has had significant increase in headaches over the last 6 months, but has been unable to come in for a visit because of lack of insurance. She tells me today that she has experienced near daily nausea and intermittent vomiting with and without her headaches. She says that odors can trigger migraines and that that she becomes nauseated quickly, then the migraine occurs. She reports that pain starts in her right eye and then spread into her head. She is incapacitated by the headache and must go to bed to get relief.  She said that since her last visit she saw a headache specialist who recommended different medications and Botox therapy but that when she lost insurance she was unable to return there. She said that she has been seen by gastroenterology who told her that the frequent nausea was related to migraine, as well as her OB to be sure that she wasn't pregnant. 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 She says that she enjoys the work with the children and denies feeling stressed with her responsibilities. She is interested in trying medications to get control of migraines as she has missed time from work due to migraine headache.   Anna DadaZarria has been otherwise generally healthy since she was last seen. She has no other health concerns today other than previously mentioned.   Review of systems: Please see HPI for neurologic and other pertinent review of systems.  Otherwise all other systems were reviewed and were negative.  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   Social history: Social History   Socioeconomic History   Marital status: Single    Spouse name: Not on file   Number of children: 0   Years of education: college   Highest education level: Not on file  Occupational History   Occupation: Magazine features editortudent  Social Needs   Financial resource strain: Not on file   Food insecurity    Worry: Not on file    Inability: Not on file   Transportation needs    Medical: Not on file    Non-medical: Not on file  Tobacco Use   Smoking status: Never Smoker   Smokeless tobacco: Never Used  Substance and Sexual Activity   Alcohol use: No  Alcohol/week: 0.0 standard drinks   Drug use: No   Sexual activity: Not Currently    Partners: Male    Birth control/protection: None  Lifestyle   Physical activity    Days per week: Not on file    Minutes per session: Not on file   Stress: Not on file  Relationships   Social connections    Talks on phone: Not on file    Gets together: Not on file    Attends religious service: Not on file    Active member of club or organization: Not on file    Attends meetings of clubs or organizations: Not on file    Relationship status: Not on file   Intimate partner violence    Fear of current or ex  partner: Not on file    Emotionally abused: Not on file    Physically abused: Not on file    Forced sexual activity: Not on file  Other Topics Concern   Not on file  Social History Narrative   Anna Fitzpatrick is employed at [employer] in [place]. Lives with mom Debie enjoys reading and working with kids.    Past/failed meds: Topiramate IR - she was concerned with it interfering with birth control  Allergies: Allergies  Allergen Reactions   Zithromax [Azithromycin] Hives and Rash     Immunizations:  There is no immunization history on file for this patient.    Diagnostics/Screenings:  Physical Exam: BP 108/80    Pulse 66    Ht 5' 1.5" (1.562 m)    Wt 153 lb (69.4 kg)    BMI 28.44 kg/m   General: Well developed, well nourished, seated, in no evident distress, black hair, brown eyes, right handed Head: Head normocephalic and atraumatic.  Oropharynx benign. Neck: Supple with no carotid bruits Cardiovascular: Regular rate and rhythm, no murmurs Respiratory: Breath sounds clear to auscultation Musculoskeletal: No obvious deformities or scoliosis Skin: No rashes or neurocutaneous lesions  Neurologic Exam Mental Status: Awake and fully alert.  Oriented to place and time.  Recent and remote memory intact.  Attention span, concentration, and fund of knowledge appropriate.  Mood and affect appropriate. Cranial Nerves: Fundoscopic exam reveals sharp disc margins.  Pupils equal, briskly reactive to light.  Extraocular movements full without nystagmus.  Visual fields full to confrontation.  Hearing intact and symmetric to finger rub.  Facial sensation intact.  Face tongue, palate move normally and symmetrically.  Neck flexion and extension normal. Motor: Normal bulk and tone. Normal strength in all tested extremity muscles. Sensory: Intact to touch and temperature in all extremities.  Coordination: Rapid alternating movements normal in all extremities.  Finger-to-nose and heel-to  shin performed accurately bilaterally.  Romberg negative. Gait and Station: Arises from chair without difficulty.  Stance is normal. Gait demonstrates normal stride length and balance.   Able to heel, toe and tandem walk without difficulty. Reflexes: 1+ and symmetric. Toes downgoing.  Impression: 1. Migraine without aura 2. Episodic tension headache 3. Migraine variant - ice pick headache 4. History of hypotension 5. Episodes of syncope 6. Insomnia  Recommendations for plan of care: The patient's previous [facility] records were reviewed. Anna Fitzpatrick has neither had nor required imaging or lab studies since the last visit. She is a 23 year old woman with history of migraine and tension headaches, history of hypotension, syncopal episodes and insomnia. She reports increased headaches over the last 6 months and complains of daily nausea and vomiting. I talked with her about trying Pantoprazole  for 1 month to get her stomach settled down, and recommended starting Trokendi XR for migraine prevention and gave her samples of that. I gave her Sumatriptan for severe migraine relief and explained to her that it will not be beneficial for tension headaches. I gave her Ondansetron ODT for nausea. I asked her to keep track of headaches using a headache diary and asked her to return for follow up in 1 month. I gave her a note for missing work this week. At her next visit, I will also work on transition to adult neurology practice.  Anna Fitzpatrick agreed with plans made today.   The medication list was reviewed and reconciled. No changes were made in the prescribed medications today. A complete medication list was provided to the patient.  Allergies as of 09/05/2018      Reactions   Zithromax [azithromycin] Hives, Rash      Medication List       Accurate as of September 05, 2018 11:59 PM. If you have any questions, ask your nurse or doctor.        STOP taking these medications   cyclobenzaprine 5 MG tablet Commonly known  as: FLEXERIL Stopped by: Elveria Risingina Lauraann Missey, NP   promethazine 12.5 MG tablet Commonly known as: PHENERGAN Stopped by: Elveria Risingina Sharmaine Bain, NP   Qudexy XR 25 MG Cs24 sprinkle cap Generic drug: topiramate ER Replaced by: Trokendi XR 25 MG Cp24 Stopped by: Elveria Risingina Teneil Shiller, NP     TAKE these medications   aspirin-acetaminophen-caffeine 250-250-65 MG tablet Commonly known as: Excedrin Migraine Take 2 tablets at onset of migraine. May repeat in 4-6 hours if needed.   ciprofloxacin 0.3 % ophthalmic solution Commonly known as: CILOXAN Place 1 drop into both eyes every 2 (two) hours. Administer 1 drop, every 2 hours, while awake, for 2 days. Then 1 drop, every 4 hours, while awake, for the next 5 days.   ondansetron 4 MG disintegrating tablet Commonly known as: Zofran ODT Take 1 tablet at onset of nausea. May repeat every 6-8 hours as needed. What changed:   how much to take  how to take this  when to take this  reasons to take this  additional instructions Changed by: Elveria Risingina Schyler Butikofer, NP   pantoprazole 40 MG tablet Commonly known as: Protonix Take 1 tablet (40 mg total) by mouth 2 (two) times daily before a meal.   SUMAtriptan 25 MG tablet Commonly known as: IMITREX Take 1 tablet (25 mg total) by mouth every 2 (two) hours as needed for migraine. May repeat in 2 hours if headache persists or recurs. Started by: Elveria Risingina Tabria Steines, NP   tiZANidine 4 MG tablet Commonly known as: Zanaflex Take 1 tablet every 6- 8 hours as needed for pain   Trokendi XR 25 MG Cp24 Generic drug: Topiramate ER Take 1 tablet at bedtime Replaces: Qudexy XR 25 MG Cs24 sprinkle cap Started by: Elveria Risingina Miyonna Ormiston, NP       Total time spent with the patient was 45 minutes, of which 50% or more was spent in counseling and coordination of care.  Elveria Risingina Michalina Calbert NP-C [facility] Ph. [phone] Fax [phone]

## 2018-09-06 ENCOUNTER — Encounter (INDEPENDENT_AMBULATORY_CARE_PROVIDER_SITE_OTHER): Payer: Self-pay | Admitting: Family

## 2018-09-28 ENCOUNTER — Other Ambulatory Visit (INDEPENDENT_AMBULATORY_CARE_PROVIDER_SITE_OTHER): Payer: Self-pay | Admitting: Family

## 2018-09-28 DIAGNOSIS — G43001 Migraine without aura, not intractable, with status migrainosus: Secondary | ICD-10-CM

## 2018-10-03 ENCOUNTER — Encounter: Payer: Self-pay | Admitting: Gastroenterology

## 2018-10-03 ENCOUNTER — Encounter (INDEPENDENT_AMBULATORY_CARE_PROVIDER_SITE_OTHER): Payer: Self-pay

## 2018-10-03 ENCOUNTER — Ambulatory Visit (INDEPENDENT_AMBULATORY_CARE_PROVIDER_SITE_OTHER): Payer: Self-pay | Admitting: Family

## 2018-10-03 ENCOUNTER — Other Ambulatory Visit: Payer: Self-pay

## 2018-10-03 ENCOUNTER — Encounter (INDEPENDENT_AMBULATORY_CARE_PROVIDER_SITE_OTHER): Payer: Self-pay | Admitting: Family

## 2018-10-03 DIAGNOSIS — R112 Nausea with vomiting, unspecified: Secondary | ICD-10-CM

## 2018-10-03 DIAGNOSIS — G43001 Migraine without aura, not intractable, with status migrainosus: Secondary | ICD-10-CM

## 2018-10-03 DIAGNOSIS — G4700 Insomnia, unspecified: Secondary | ICD-10-CM

## 2018-10-03 MED ORDER — TROKENDI XR 25 MG PO CP24: ORAL_CAPSULE | ORAL | 1 refills | Status: DC

## 2018-10-03 MED ORDER — TIZANIDINE HCL 4 MG PO TABS: ORAL_TABLET | ORAL | 0 refills | Status: DC

## 2018-10-03 MED ORDER — TRAZODONE HCL 50 MG PO TABS: ORAL_TABLET | ORAL | 0 refills | Status: DC

## 2018-10-03 NOTE — Patient Instructions (Addendum)
Thank you for meeting with me by Webex today. You have a condition called migraine without aura. This is a type of severe headache that occurs in a normal brain and often runs in families. To treat your migraines we will try the following - medications and lifestyle measures.    To reduce the frequency of the migraines, we will try a medication that is FDA approved to prevent migraines from occurring. This medication is Topiramate ER (Trokendi XR). To take it you will take 1 tablet twice per day.  It is important that you drink plenty of water while taking this medication to prevent side effects of tingling in your fingers and toes.    To treat your migraines when they occur I have prescribed the following medications: 1. Sumatriptan 25mg  - take 1 tablet along with Ibuprofen 400mg  (2 of the 200mg  tablets) at the onset of the migraine). If the migraines is still present in 2 hours, take 1 more tablet.  2.  Tizanidine 4mg  - this is to to be taken ONLY at bedtime if you have a headache as you are going to bed. When this happens - take 1 of the Tizanidine tablets along with Ibuprofen or Tylenol and go to bed.  3. I refilled the Ondansetron 4mg  to take at onset of migraine for nausea.  Continue the Pantoprazole 40mg  for reflux for now.   For your problems sleeping, I have also prescribed Trazodone. Take 1 tablet at bedtime. Remember that it is important to stay on a regular sleep schedule and to minimize use of electronic devices at bedtime and to    There are some things that you can do that will help to minimize the frequency and severity of headaches. These are: 1. Get enough sleep and sleep in a regular pattern 2. Hydrate yourself well 3. Don't skip meals  4. Take breaks when working at a computer or playing video games 5. Exercise every day 6. Manage stress   You should be getting at least 8-9 hours of sleep each night. Bedtime should be a set time for going to bed and getting up with few  exceptions. Try to avoid napping during the day as this interrupts nighttime sleep patterns. If you need to nap during the day, it should be less than 45 minutes and should occur in the early afternoon.    You should be drinking 48-60oz of water per day, more on days when you exercise or are outside in summer heat. Try to avoid beverages with sugar and caffeine as they add empty calories, increase urine output and defeat the purpose of hydrating your body.    You should be eating 3 meals per day. If you are very active, you may need to also have a couple of snacks per day.    If you work at a computer or laptop, play games on a computer, tablet, phone or device such as a playstation or xbox, remember that this is continuous stimulation for your eyes. Take breaks at least every 30 minutes. Also there should be another light on in the room - never play in total darkness as that places too much strain on your eyes.    Exercise at least 20-30 minutes every day - not strenuous exercise but something like walking, stretching, etc.    Keep a headache diary and bring it with you when you come back for your next visit.    Please sign up for MyChart if you have not done so.  Please plan to return for follow up in 4 weeks or sooner if needed.

## 2018-10-03 NOTE — Progress Notes (Signed)
This is a Pediatric Specialist E-Visit follow up consult provided via WebEx Anna Fitzpatrick and consented to an E-Visit consult today.  Location of patient: Anna Fitzpatrick is at Home(location) Location of provider: Elveria Risingina Cornisha Zetino, NP is at Office (location) Patient was referred by No ref. provider found   The following participants were involved in this E-Visit: Anna MunroeFabiola Cardenas, CMA              Elveria Risingina Corderius Saraceni, NP Total time on call: 15 min Follow up: 4 weeks  Patient: Anna Fitzpatrick MRN: [id number] Sex: female DOB: 08-05-1995   Provider: Elveria Risingina Lenia Housley NP-C [facility]  Visit type: Routine Follow-Up  Last visit: 09/05/2018  Referral source: Anna Shoemakerobert Ehniger, MD [facility]  Brief history:  Copied from previous record History of headaches that began in 2010. She has tension headaches, migraine without aura, ice pick headaches, insomnia and history of tension headaches.    Today's concerns:  Anna Fitzpatrick tells me today that she has continued to have frequent migraine headaches but that they aren't lasting as long - now about 2 days whereas they were lasting 3-4 days. going Migraines can be triggered by odors or loud noise. She says that she becomes nauseated quickly, then develops pain usually behind her right eye or sometimes both eyes, that spreads across her head. She has found nothing that gives relief other than going to bed to sleep.   When she was last seen she was started on Trokendi XR for migraine prevention and has tolerated that. She complains of ongoing problems with nausea and vomiting, sometimes occurring with a migraine, sometimes occurring independent of a migraine. She has been taking Pantoprazole as I recommended at her last visit but doesn't feel that it has been helpful. She said that she has trouble eating due to ongoing nausea and tries to eat small meals instead of skipping meals as she had been doing. She  also complains of problems sleeping, saying that she has trouble going to sleep and staying asleep. Anna Fitzpatrick said that she tried Melatonin and that it helped for awhile to get her to go to sleep but that she did not stay asleep. Anna Fitzpatrick has tried Tizanidine at bedtime when a migraine keeps her awake. She doesn't feel that it has been helpful. Anna Fitzpatrick estimates that she only gets a few hours of sleep each night. She denies anxiety or racing thoughts when she awakens at night but says that she is simply awake and unable to return to sleep.   Anna Fitzpatrick has been otherwise generally healthy since she was last seen. She has no other health concerns today other than previously mentioned.   Review of systems: Please see HPI for neurologic and other pertinent review of systems. Otherwise all other systems were reviewed and were negative.  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   Social history: Social History   Socioeconomic History   Marital status: Single    Spouse name: Not on file   Number of children: 0   Years of education: college   Highest education level: Not on file  Occupational History   Occupation: Magazine features editor strain: Not on file   Food insecurity    Worry: Not on file    Inability: Not on file   Transportation needs    Medical: Not on file     Non-medical: Not on file  Tobacco Use   Smoking status: Never Smoker   Smokeless tobacco: Never Used  Substance and Sexual Activity   Alcohol use: No    Alcohol/week: 0.0 standard drinks   Drug use: No   Sexual activity: Not Currently    Partners: Male    Birth control/protection: None  Lifestyle   Physical activity    Days per week: Not on file    Minutes per session: Not on file   Stress: Not on file  Relationships   Social connections    Talks on phone: Not on file    Gets together: Not on file    Attends religious service: Not on file    Active member of club or organization: Not on file    Attends meetings of clubs or organizations: Not on file    Relationship status: Not on file   Intimate partner violence    Fear of current or ex partner: Not on file    Emotionally abused: Not on file    Physically abused: Not on file    Forced sexual activity: Not on file  Other Topics Concern   Not on file  Social History Narrative   Anna Fitzpatrick 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 Lives with mom Anna Fitzpatrick enjoys reading and working with kids.     Past/failed meds: Topiramate IR - she was concerned with it interfering with birth control  Allergies: Allergies  Allergen Reactions   Zithromax [Azithromycin] Hives and Rash     Immunizations:  There is no immunization history on file for this patient.    Diagnostics/Screenings:    Physical Exam: There were no vitals taken for this visit.  General: Well developed, well nourished young woman, seated, in no evident distress, brown/black hair, brown eyes, right handed Head: Head normocephalic and atraumatic. Neck: Supple Musculoskeletal: No obvious deformities or scoliosis Skin: No rashes or neurocutaneous lesions  Neurologic Exam Mental Status: Awake and fully alert.  Oriented to place and time.  Recent and remote memory intact.  Attention span, concentration, and fund of knowledge appropriate.  Mood and  affect appropriate. Cranial Nerves: Extraocular movements full without nystagmus. Hearing intact and symmetric to voice.  Facial sensation intact.  Face tongue, palate move normally and symmetrically. Shoulder shrug normal. Motor: Normal functional bulk, tone and strength Sensory: Intact to touch and temperature in all extremities.  Coordination: Rapid alternating movements normal in all extremities.  Finger-to-nose and heel-to shin performed accurately bilaterally. Gait and Station: Arises from chair without difficulty.  Stance is normal. Gait demonstrates normal stride length and balance.   Impression: 1. Migraine without aura 2. Episodic tension headache 3. Migraine variant - ice pick headache 4. History of hypotension 5. Episodes of syncope 6. Insomnia  Recommendations for plan of care: The patient's previous [facility] records were reviewed. Brittannie has neither had  nor required imaging or lab studies since the last visit. She is a 23 year old woman with history of migraine and tension headaches, migraine variant, history of hypotension and syncope, and insomnia. She is taking and tolerating Trokendi XR for migraine prevention. I recommended increasing the dose to 2 capsules per day and reminded Blanca of the need to drink plenty of water while taking this medication. She is having problems with sleep and I recommended good sleep hygiene as well as a trial of Trazodone. Meranda is having ongoing complaints of frequent nausea and vomiting, and I will refer her to gastroenterology for that. I asked her to keep track of her headaches and to return for follow up in 4 weeks or sooner if needed. Now that she is no longer in school, I will plan on referring Monserat to adult neurology practice for transfer of care at her next visit.  [place] agreed with the plans made today.   The medication list was reviewed and reconciled. I reviewed changes that were made in the prescribed medications today. A complete  medication list was provided to the patient.  Allergies as of 10/03/2018      Reactions   Zithromax [azithromycin] Hives, Rash      Medication List       Accurate as of October 03, 2018  2:39 PM. If you have any questions, ask your nurse or doctor.        aspirin-acetaminophen-caffeine 469-629-52 MG tablet Commonly known as: Excedrin Migraine Take 2 tablets at onset of migraine. May repeat in 4-6 hours if needed.   ciprofloxacin 0.3 % ophthalmic solution Commonly known as: CILOXAN Place 1 drop into both eyes every 2 (two) hours. Administer 1 drop, every 2 hours, while awake, for 2 days. Then 1 drop, every 4 hours, while awake, for the next 5 days.   ondansetron 4 MG disintegrating tablet Commonly known as: Zofran ODT Take 1 tablet at onset of nausea. May repeat every 6-8 hours as needed.   pantoprazole 40 MG tablet Commonly known as: PROTONIX TAKE 1 TABLET (40 MG TOTAL) BY MOUTH 2 (TWO) TIMES DAILY BEFORE A MEAL.   SUMAtriptan 25 MG tablet Commonly known as: IMITREX TAKE 1 TABLET BY MOUTH EVERY 2 HOURS AS NEEDED FOR MIGRAINE. MAY REPEAT IN 2 HOURS IF HEADACHE PERSISTS OR RECURS   tiZANidine 4 MG tablet Commonly known as: Zanaflex Take 1 tablet every 6- 8 hours as needed for pain   Trokendi XR 25 MG Cp24 Generic drug: Topiramate ER Take 1 tablet at bedtime        Total time spent with the patient was 15 minutes, of which 50% or more was spent in counseling and coordination of care.  Rockwell Germany NP-C [facility] Ph. [phone] Fax [phone]

## 2018-10-29 ENCOUNTER — Other Ambulatory Visit (INDEPENDENT_AMBULATORY_CARE_PROVIDER_SITE_OTHER): Payer: Self-pay | Admitting: Family

## 2018-10-29 DIAGNOSIS — G4700 Insomnia, unspecified: Secondary | ICD-10-CM

## 2018-11-05 ENCOUNTER — Encounter: Payer: Self-pay | Admitting: Gastroenterology

## 2018-11-05 ENCOUNTER — Other Ambulatory Visit (INDEPENDENT_AMBULATORY_CARE_PROVIDER_SITE_OTHER): Payer: PRIVATE HEALTH INSURANCE

## 2018-11-05 ENCOUNTER — Ambulatory Visit (INDEPENDENT_AMBULATORY_CARE_PROVIDER_SITE_OTHER): Payer: Self-pay | Admitting: Gastroenterology

## 2018-11-05 VITALS — BP 86/54 | HR 94 | Temp 98.2°F | Ht 61.5 in | Wt 157.0 lb

## 2018-11-05 DIAGNOSIS — R112 Nausea with vomiting, unspecified: Secondary | ICD-10-CM

## 2018-11-05 LAB — COMPREHENSIVE METABOLIC PANEL
ALT: 15 U/L (ref 0–35)
AST: 18 U/L (ref 0–37)
Albumin: 4.2 g/dL (ref 3.5–5.2)
Alkaline Phosphatase: 75 U/L (ref 39–117)
BUN: 16 mg/dL (ref 6–23)
CO2: 27 mEq/L (ref 19–32)
Calcium: 9.3 mg/dL (ref 8.4–10.5)
Chloride: 104 mEq/L (ref 96–112)
Creatinine, Ser: 0.91 mg/dL (ref 0.40–1.20)
GFR: 92.25 mL/min (ref >60.00)
Glucose, Bld: 90 mg/dL (ref 70–99)
Potassium: 3.6 mEq/L (ref 3.5–5.1)
Sodium: 139 mEq/L (ref 135–145)
Total Bilirubin: 0.3 mg/dL (ref 0.2–1.2)
Total Protein: 7.4 g/dL (ref 6.0–8.3)

## 2018-11-05 LAB — CBC WITH DIFFERENTIAL/PLATELET
Basophils Absolute: 0 10*3/uL (ref 0.0–0.1)
Basophils Relative: 0.4 % (ref 0.0–3.0)
Eosinophils Absolute: 0.1 10*3/uL (ref 0.0–0.7)
Eosinophils Relative: 1.7 % (ref 0.0–5.0)
HCT: 39.8 % (ref 36.0–46.0)
Hemoglobin: 13.1 g/dL (ref 12.0–15.0)
Lymphocytes Relative: 25.9 % (ref 12.0–46.0)
Lymphs Abs: 1.6 10*3/uL (ref 0.7–4.0)
MCHC: 33 g/dL (ref 30.0–36.0)
MCV: 82.6 fl (ref 78.0–100.0)
Monocytes Absolute: 0.4 10*3/uL (ref 0.1–1.0)
Monocytes Relative: 5.9 % (ref 3.0–12.0)
Neutro Abs: 4 10*3/uL (ref 1.4–7.7)
Neutrophils Relative %: 66.1 % (ref 43.0–77.0)
Platelets: 318 10*3/uL (ref 150.0–400.0)
RBC: 4.81 Mil/uL (ref 3.87–5.11)
RDW: 13.5 % (ref 11.5–15.5)
WBC: 6.1 10*3/uL (ref 4.0–10.5)

## 2018-11-05 LAB — TSH: TSH: 0.75 u[IU]/mL (ref 0.35–4.50)

## 2018-11-05 NOTE — Patient Instructions (Signed)
You have been scheduled for an endoscopy. Please follow written instructions given to you at your visit today. If you use inhalers (even only as needed), please bring them with you on the day of your procedure. Your physician has requested that you go to www.startemmi.com and enter the access code given to you at your visit today. This web site gives a general overview about your procedure. However, you should still follow specific instructions given to you by our office regarding your preparation for the procedure.  Your provider has requested that you go to the basement level for lab work before leaving today. Press "B" on the elevator. The lab is located at the first door on the left as you exit the elevator.   Thank you for entrusting me with your care and choosing [facility].  Dr Jacobs  

## 2018-11-05 NOTE — Progress Notes (Signed)
HPI: This is a very pleasant 23 year old woman who was referred to me by Rockwell Germany, NP  to evaluate chronic nausea and vomiting.    Chief complaint is chronic nausea and vomiting  She has had problems with nausea and vomiting for many years.  At least 2.  She says she has nausea daily.  She vomits about once a week.  The nausea throughout the day comes and goes.  Certain smells can make her very nauseous certain foods will make her nauseous as well but is not a with the same foods every day.  She tried a proton pump inhibitor in the past and that did not help at all.  She does not have any GERD symptoms.  When she vomits she does not see blood.  She has no dysphasia.  She does take NSAIDs around her menstrual cycle only only 3 or 4 days during that time.  Her nausea is not particularly worse then.  She does not drink alcohol or take any illicit drugs.   She has never had upper endoscopy.   Review of systems: Pertinent positive and negative review of systems were noted in the above HPI section. All other review negative.   Past Medical History:  Diagnosis Date  . Asthma   . Headache(784.0)   . History of rectal polypectomy 2012    Past Surgical History:  Procedure Laterality Date  . adenoids   2004   Removed  . COLONOSCOPY     rectal polyp removed  . ear tubes removed  2002 and 2009  . EEG ADULT  10/14/2016      . TYMPANOSTOMY TUBE PLACEMENT  1999    Current Outpatient Medications  Medication Sig Dispense Refill  . aspirin-acetaminophen-caffeine (EXCEDRIN MIGRAINE) 250-250-65 MG tablet Take 2 tablets at onset of migraine. May repeat in 4-6 hours if needed. 30 tablet 0  . ondansetron (ZOFRAN ODT) 4 MG disintegrating tablet Take 1 tablet at onset of nausea. May repeat every 6-8 hours as needed. 30 tablet 0  . pantoprazole (PROTONIX) 40 MG tablet TAKE 1 TABLET (40 MG TOTAL) BY MOUTH 2 (TWO) TIMES DAILY BEFORE A MEAL. 60 tablet 0  . SUMAtriptan (IMITREX) 25 MG tablet TAKE 1  TABLET BY MOUTH EVERY 2 HOURS AS NEEDED FOR MIGRAINE. MAY REPEAT IN 2 HOURS IF HEADACHE PERSISTS OR RECURS 8 tablet 0  . tiZANidine (ZANAFLEX) 4 MG tablet Take 1 tablet every 6- 8 hours as needed for pain 20 tablet 0  . Topiramate ER (TROKENDI XR) 25 MG CP24 Take 1 tablet in the morning and take 1 tablet at bedtime 60 capsule 1  . traZODone (DESYREL) 50 MG tablet TAKE 1 TABLET BY MOUTH EVERYDAY AT BEDTIME 30 tablet 0   No current facility-administered medications for this visit.     Allergies as of 11/05/2018 - Review Complete 11/05/2018  Allergen Reaction Noted  . Zithromax [azithromycin] Hives and Rash 08/07/2012    History reviewed. No pertinent family history.  Social History   Socioeconomic History  . Marital status: Single    Spouse name: Not on file  . Number of children: 0  . Years of education: college  . Highest education level: Not on file  Occupational History  . Occupation: Ship broker  Social Needs  . Financial resource strain: Not on file  . Food insecurity    Worry: Not on file    Inability: Not on file  . Transportation needs    Medical: Not on file    Non-medical:  Not on file  Tobacco Use  . Smoking status: Never Smoker  . Smokeless tobacco: Never Used  Substance and Sexual Activity  . Alcohol use: No    Alcohol/week: 0.0 standard drinks  . Drug use: No  . Sexual activity: Not Currently    Partners: Male    Birth control/protection: None  Lifestyle  . Physical activity    Days per week: Not on file    Minutes per session: Not on file  . Stress: Not on file  Relationships  . Social Musician on phone: Not on file    Gets together: Not on file    Attends religious service: Not on file    Active member of club or organization: Not on file    Attends meetings of clubs or organizations: Not on file    Relationship status: Not on file  . Intimate partner violence    Fear of current or ex partner: Not on file    Emotionally abused: Not on  file    Physically abused: Not on file    Forced sexual activity: Not on file  Other Topics Concern  . 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 Lives with mom Toyia enjoys reading and working with kids.     Physical Exam: BP (!) 86/54   Pulse 94   Temp 98.2 F (36.8 C)   Ht 5' 1.5" (1.562 m)   Wt 157 lb (71.2 kg)   LMP 10/29/2018 (Exact Date)   BMI 29.18 kg/m  Constitutional: generally well-appearing Psychiatric: alert and oriented x3 Eyes: extraocular movements intact Mouth: oral pharynx moist, no lesions Neck: supple no lymphadenopathy Cardiovascular: heart regular rate and rhythm Lungs: clear to auscultation bilaterally Abdomen: soft, nontender, nondistended, no obvious ascites, no peritoneal signs, normal bowel sounds Extremities: no lower extremity edema bilaterally Skin: no lesions on visible extremities   Assessment and plan: 23 y.o. female with chronic nausea and vomiting  Unclear etiology.  Certainly might be nongastrointestinal related since certain smells will actually cause her to get nauseous.  Going to get a basic set of labs today including a CBC, complete metabolic profile and we will arrange a upper endoscopy to be performed at her soonest convenience to look for significant gastritis, peptic ulcer disease, H. pylori infection.  She has tried proton pump inhibitors in the past but they do not work and do not help so I would hold on retrying that for now.   Please see the "Patient Instructions" section for addition details about the plan.   Rob Bunting, MD [facility] 11/05/2018, 3:33 PM  Cc: Elveria Rising, NP

## 2018-11-06 ENCOUNTER — Ambulatory Visit (INDEPENDENT_AMBULATORY_CARE_PROVIDER_SITE_OTHER): Payer: Self-pay | Admitting: Family

## 2018-11-06 ENCOUNTER — Encounter (INDEPENDENT_AMBULATORY_CARE_PROVIDER_SITE_OTHER): Payer: Self-pay | Admitting: Family

## 2018-11-06 ENCOUNTER — Other Ambulatory Visit: Payer: Self-pay

## 2018-11-06 DIAGNOSIS — G44219 Episodic tension-type headache, not intractable: Secondary | ICD-10-CM

## 2018-11-06 DIAGNOSIS — G43809 Other migraine, not intractable, without status migrainosus: Secondary | ICD-10-CM

## 2018-11-06 DIAGNOSIS — G43001 Migraine without aura, not intractable, with status migrainosus: Secondary | ICD-10-CM

## 2018-11-06 DIAGNOSIS — Z8679 Personal history of other diseases of the circulatory system: Secondary | ICD-10-CM

## 2018-11-06 NOTE — Progress Notes (Signed)
This is a Pediatric Specialist E-Visit follow up consult provided via Telephone, MyChart, WebEx Illene LabradorZarria L Mcelhiney consented to an E-Visit consult today.  Location of patient: Anna Fitzpatrick is at home Location of provider: Damita Dunningsina Emmarose Klinke,NP-C is at office Patient was referred by Blair Heysobert Ehinger. MD   The following participants were involved in this E-Visit: Patient and NP-C  Chief Complain/ Reason for E-Visit today: headaches Total time on call: 8 min Follow up: 2 weeks     Illene LabradorZarria L Fitzpatrick   MRN:  [id number]  1995/12/24   Provider: Elveria Risingina Litzi Binning NP-C [facility]  Visit type: Routine return visit  Last visit: 10/03/2018  Referral source: Blair Heysobert Ehinger, MD [facility] and [facility]  Brief history:  Copied from previous record History of headaches that began in 2010. She has tension headaches, migraine without aura, ice pick headaches, insomnia and history of tension headaches.She is taking and tolerating Trokendi XR for migraine prevention.  Today's concerns: Anna Fitzpatrick tells me today that the headache frequency as diminished slightly and that she is having a migraine about every other week. She was evaluated by GI for her complaints of frequent vomiting and has an endoscopy scheduled for later this week. She also has problems with sleep and I recommended a trial of Trazodone at her last visit. Anna Fitzpatrick tells me today that it made her nose feel stuffy and that she was unable to sleep due to feeling that she could not breath well so she stopped the medication.   Anna Fitzpatrick has been otherwise generally healthy since her last visit and has no other health concerns today other than previously mentioned.   Review of systems: Please see HPI for neurologic and other pertinent review of systems. Otherwise all other systems were reviewed and were negative.  Problem List: Patient Active Problem List   Diagnosis Date Noted  . Syncope 01/21/2016  .  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 History of hypotension 11/20/2013  . Migraine without aura 08/07/2012  . Episodic tension type headache 08/07/2012     Past Medical History:  Diagnosis Date  . Asthma   . Headache(784.0)   . History of rectal polypectomy 2012    Past medical history comments: See HPI Copied from previous record:   Surgical history: Past Surgical History:  Procedure Laterality Date  . adenoids   2004   Removed  . COLONOSCOPY     rectal polyp removed  . ear tubes removed  2002 and 2009  . EEG ADULT  10/14/2016      . TYMPANOSTOMY TUBE PLACEMENT  1999     Family history: family history is not on file.   Social history: Social History   Socioeconomic History  . Marital status: Single    Spouse name: Not on file  . Number of children: 0  . Years of education: college  . Highest education level: Not on file  Occupational History  . Occupation: Consulting civil engineertudent  Social Needs  . Financial resource strain: Not on file  . Food insecurity    Worry: Not on file    Inability: Not on file  . Transportation needs    Medical: Not on file    Non-medical: Not on file  Tobacco Use  . Smoking status: Never Smoker  . Smokeless tobacco: Never Used  Substance and Sexual Activity  . Alcohol use: No    Alcohol/week: 0.0 standard drinks  . Drug use: No  . Sexual activity: Not Currently  Partners: Male    Birth control/protection: None  Lifestyle  . Physical activity    Days per week: Not on file    Minutes per session: Not on file  . Stress: Not on file  Relationships  . Social Musician on phone: Not on file    Gets together: Not on file    Attends religious service: Not on file    Active member of club or organization: Not on file    Attends meetings of clubs or organizations: Not on file    Relationship status: Not on file  . Intimate partner violence    Fear of current or ex partner: Not on file    Emotionally abused: Not  on file    Physically abused: Not on file    Forced sexual activity: Not on file  Other Topics Concern  . Not on file  Social History Narrative   [name] is employed at a [employer] in [place]. Lives with mom Rodina enjoys reading and working with kids.     Past/failed meds: Topiramate IR - she was concerned that it would interfere with birth control Trazodone - stuffy nose  Allergies: Allergies  Allergen Reactions  . Zithromax [Azithromycin] Hives and Rash      Immunizations:  There is no immunization history on file for this patient.    Diagnostics/Screenings:    Physical Exam: LMP 10/29/2018 (Exact Date)   There was no physical examination as this was a telephone visit.  Impression: 1. Migraine without aura 2. Episodic tension headache 3. Migraine variant - ice pick headache 4. History of hypotension 5. Episodes of syncope 6. Insomnia   Recommendations for plan of care: The patient's previous [facility] records were reviewed. Chandi has neither had nor required imaging or lab studies since the last visit other than what was performed by her Gastroenterologist. She is aware of those lab results. She has an upcoming endoscopy scheduled later this week for her complaints of frequent vomiting. I recommended to Jenavi that we make no changes in her treatment plan until the endoscopy results are available. I told her to stop the Trazodone since she felt it had side effects. I reminded her to be drinking fluids liberally. I will see her back in follow up in 2 weeks or sooner if needed. I plan to refer her to adult neurology practice for transition of care at next visit. Sherrol agreed with the plans made today.   The medication list was reviewed and reconciled. I reviewed changes that were made in the prescribed medications today. A complete medication list was provided to the patient.  Allergies as of 11/06/2018      Reactions   Zithromax [azithromycin] Hives, Rash       Medication List       Accurate as of November 06, 2018  7:51 PM. If you have any questions, ask your nurse or doctor.        STOP taking these medications   traZODone 50 MG tablet Commonly known as: DESYREL Stopped by: Elveria Rising, NP     TAKE these medications   aspirin-acetaminophen-caffeine [id number] MG tablet Commonly known as: Excedrin Migraine Take 2 tablets at onset of migraine. May repeat in 4-6 hours if needed.   ondansetron 4 MG disintegrating tablet Commonly known as: Zofran ODT Take 1 tablet at onset of nausea. May repeat every 6-8 hours as needed.   pantoprazole 40 MG tablet Commonly known as: PROTONIX TAKE 1 TABLET (40 MG  TOTAL) BY MOUTH 2 (TWO) TIMES DAILY BEFORE A MEAL.   SUMAtriptan 25 MG tablet Commonly known as: IMITREX TAKE 1 TABLET BY MOUTH EVERY 2 HOURS AS NEEDED FOR MIGRAINE. MAY REPEAT IN 2 HOURS IF HEADACHE PERSISTS OR RECURS   tiZANidine 4 MG tablet Commonly known as: Zanaflex Take 1 tablet every 6- 8 hours as needed for pain   Trokendi XR 25 MG Cp24 Generic drug: Topiramate ER Take 1 tablet in the morning and take 1 tablet at bedtime        Total time spent on the phone with the patient was 8 minutes, of which 50% or more was spent in counseling and coordination of care.  Rockwell Germany NP-C [facility] Ph. [phone] Fax [phone]

## 2018-11-06 NOTE — Patient Instructions (Signed)
Thank you for coming in today.   Instructions for you until your next appointment are as follows: 1. Continue the migraine preventative medication as you have been taking it.  2. Remember that it is important to drink plenty of water while you take this medication.  3. Stop the Trazodone as we discussed today 4. Be sure to keep your appointment on Friday for the test for your stomach 5. Please sign up for MyChart if you have not done so 6. Please plan to return for follow up in 2 weeks or sooner if needed.

## 2018-11-07 ENCOUNTER — Encounter: Payer: Self-pay | Admitting: Gastroenterology

## 2018-11-07 ENCOUNTER — Telehealth: Payer: Self-pay

## 2018-11-07 ENCOUNTER — Telehealth: Payer: Self-pay | Admitting: Gastroenterology

## 2018-11-07 NOTE — Telephone Encounter (Signed)
Dr Ardis Hughs were you looking for this information?

## 2018-11-07 NOTE — Telephone Encounter (Signed)
Covid-19 screening questions   Do you now or have you had a fever in the last 14 days? NO   Do you have any respiratory symptoms of shortness of breath or cough now or in the last 14 days? NO  Do you have any family members or close contacts with diagnosed or suspected Covid-19 in the past 14 days? NO  Have you been tested for Covid-19 and found to be positive? NO        

## 2018-11-07 NOTE — Telephone Encounter (Signed)
Pt called to give you the name of MD who did her surgery when she was a child. It was Dr. Alcide Goodness, his phone number is [phone]. Pt had a rectal polyp removed, records are in [place] dated 12/04/2010.

## 2018-11-08 ENCOUNTER — Encounter: Payer: Self-pay | Admitting: Gastroenterology

## 2018-11-08 ENCOUNTER — Other Ambulatory Visit: Payer: Self-pay

## 2018-11-08 ENCOUNTER — Ambulatory Visit (AMBULATORY_SURGERY_CENTER): Payer: PRIVATE HEALTH INSURANCE | Admitting: Gastroenterology

## 2018-11-08 VITALS — BP 98/60 | HR 68 | Temp 98.4°F | Resp 19 | Ht 61.0 in | Wt 157.0 lb

## 2018-11-08 DIAGNOSIS — K2970 Gastritis, unspecified, without bleeding: Secondary | ICD-10-CM

## 2018-11-08 DIAGNOSIS — R112 Nausea with vomiting, unspecified: Secondary | ICD-10-CM

## 2018-11-08 MED ORDER — SODIUM CHLORIDE 0.9 % IV SOLN: 500.0000 mL | Freq: Once | INTRAVENOUS | Status: DC

## 2018-11-08 NOTE — Progress Notes (Signed)
Report to PACU, RN, vss, BBS= Clear.  

## 2018-11-08 NOTE — Progress Notes (Signed)
Pt's states no medical or surgical changes since previsit or office visit.  KA temps, [place] vitals and Jm IV

## 2018-11-08 NOTE — Patient Instructions (Signed)
YOU HAD AN ENDOSCOPIC PROCEDURE TODAY AT THE [facility]:   Refer to the procedure report that was given to you for any specific questions about what was found during the examination.  If the procedure report does not answer your questions, please call your gastroenterologist to clarify.  If you requested that your care partner not be given the details of your procedure findings, then the procedure report has been included in a sealed envelope for you to review at your convenience later.  YOU SHOULD EXPECT: Some feelings of bloating in the abdomen. Passage of more gas than usual.  Walking can help get rid of the air that was put into your GI tract during the procedure and reduce the bloating. If you had a lower endoscopy (such as a colonoscopy or flexible sigmoidoscopy) you may notice spotting of blood in your stool or on the toilet paper. If you underwent a bowel prep for your procedure, you may not have a normal bowel movement for a few days.  Please Note:  You might notice some irritation and congestion in your nose or some drainage.  This is from the oxygen used during your procedure.  There is no need for concern and it should clear up in a day or so.  SYMPTOMS TO REPORT IMMEDIATELY:   Following upper endoscopy (EGD)  Vomiting of blood or coffee ground material  New chest pain or pain under the shoulder blades  Painful or persistently difficult swallowing  New shortness of breath  Fever of 100F or higher  Black, tarry-looking stools  For urgent or emergent issues, a gastroenterologist can be reached at any hour by calling [phone].   DIET:  We do recommend a small meal at first, but then you may proceed to your regular diet.  Drink plenty of fluids but you should avoid alcoholic beverages for 24 hours.  ACTIVITY:  You should plan to take it easy for the rest of today and you should NOT DRIVE or use heavy machinery until tomorrow (because of the sedation medicines used  during the test).    FOLLOW UP: Our staff will call the number listed on your records 48-72 hours following your procedure to check on you and address any questions or concerns that you may have regarding the information given to you following your procedure. If we do not reach you, we will leave a message.  We will attempt to reach you two times.  During this call, we will ask if you have developed any symptoms of COVID 19. If you develop any symptoms (ie: fever, flu-like symptoms, shortness of breath, cough etc.) before then, please call [phone].  If you test positive for Covid 19 in the 2 weeks post procedure, please call and report this information to us.    If any biopsies were taken you will be contacted by phone or by letter within the next 1-3 weeks.  Please call us at [phone] if you have not heard about the biopsies in 3 weeks.    SIGNATURES/CONFIDENTIALITY: You and/or your care partner have signed paperwork which will be entered into your electronic medical record.  These signatures attest to the fact that that the information above on your After Visit Summary has been reviewed and is understood.  Full responsibility of the confidentiality of this discharge information lies with you and/or your care-partner. 

## 2018-11-08 NOTE — Op Note (Signed)
[place] Patient Name: Anna Fitzpatrick Procedure Date: 11/08/2018 3:49 PM MRN: [id number] Endoscopist: Milus Banister , MD Age: 23 Referring MD:  Date of Birth: 1995-02-24 Gender: Female Account #: [id number] Procedure:                Upper GI endoscopy Indications:              Nausea Medicines:                Monitored Anesthesia Care Procedure:                Pre-Anesthesia Assessment:                           - Prior to the procedure, a History and Physical                            was performed, and patient medications and                            allergies were reviewed. The patient's tolerance of                            previous anesthesia was also reviewed. The risks                            and benefits of the procedure and the sedation                            options and risks were discussed with the patient.                            All questions were answered, and informed consent                            was obtained. Prior Anticoagulants: The patient has                            taken no previous anticoagulant or antiplatelet                            agents. ASA Grade Assessment: II - A patient with                            mild systemic disease. After reviewing the risks                            and benefits, the patient was deemed in                            satisfactory condition to undergo the procedure.                           After obtaining informed consent, the endoscope was  passed under direct vision. Throughout the                            procedure, the patient's blood pressure, pulse, and                            oxygen saturations were monitored continuously. The                            Endoscope was introduced through the mouth, and                            advanced to the second part of duodenum. The upper                            GI endoscopy was accomplished without  difficulty.                            The patient tolerated the procedure well. Scope In: Scope Out: Findings:                 Mild inflammation characterized by erythema and                            granularity was found in the gastric antrum.                            Biopsies were taken with a cold forceps for                            histology.                           The exam was otherwise without abnormality. Complications:            No immediate complications. Estimated blood loss:                            None. Estimated Blood Loss:     Estimated blood loss: none. Impression:               - Mild gastritis. Biopsied to check for H. pylori.                           - The examination was otherwise normal. Recommendation:           - Patient has a contact number available for                            emergencies. The signs and symptoms of potential                            delayed complications were discussed with the                            patient. Return to normal activities tomorrow.  Written discharge instructions were provided to the                            patient.                           - Resume previous diet.                           - Continue present medications.                           - Await pathology results. Rachael Feeaniel P Elaiza Shoberg, MD 11/08/2018 3:58:23 PM This report has been signed electronically.

## 2018-11-08 NOTE — Telephone Encounter (Signed)
No, but it's nice to have

## 2018-11-10 ENCOUNTER — Encounter (INDEPENDENT_AMBULATORY_CARE_PROVIDER_SITE_OTHER): Payer: Self-pay

## 2018-11-12 ENCOUNTER — Telehealth: Payer: Self-pay

## 2018-11-12 NOTE — Telephone Encounter (Signed)
I called Anna Fitzpatrick but the mailbox was full. I will call again later. TG

## 2018-11-12 NOTE — Telephone Encounter (Signed)
Follow up call attempted.  Mailbox is full, unable to leave message.

## 2018-11-12 NOTE — Telephone Encounter (Signed)
Patient called back and said that she doesn't know if it will go away or not but she is having the same problems she was before and now her stomach is actually hurting when she eats stuff.

## 2018-11-12 NOTE — Telephone Encounter (Signed)
I called and talked with Anna Fitzpatrick. She said that her cousin is in early 67's and has an aneurysm that is being watched but no intervention as yet. Her other relatives mentioned were elderly when the aneurysms were diagnosed. She wants MRI done to see if she has aneurysm causing her migraines. I talked with Anna Fitzpatrick and told her that her examination has been normal and that was reassuring. However with her family history I will order the MRI & MRA but explained that these tests must be approved by her insurance company before they can be scheduled. We also talked about whether or not she could do the testing without sedation. She admits to some anxiety but feels that she could do the imaging studies. After discussion, she decided to check her insurance benefits and let me know how she wants to proceed. TG

## 2018-11-13 ENCOUNTER — Telehealth: Payer: Self-pay

## 2018-11-13 NOTE — Telephone Encounter (Signed)
Please let her know that I'm still waiting for the biopsy results. We'll contact her after that.

## 2018-11-13 NOTE — Telephone Encounter (Signed)
Returned patient call today.  Informed patient that we are still waiting on pathology and will contact her once that is received.  Patient verbalized understanding.

## 2018-11-14 ENCOUNTER — Telehealth (INDEPENDENT_AMBULATORY_CARE_PROVIDER_SITE_OTHER): Payer: Self-pay | Admitting: Family

## 2018-11-14 DIAGNOSIS — G43001 Migraine without aura, not intractable, with status migrainosus: Secondary | ICD-10-CM

## 2018-11-14 DIAGNOSIS — G43809 Other migraine, not intractable, without status migrainosus: Secondary | ICD-10-CM

## 2018-11-14 NOTE — Telephone Encounter (Signed)
°  Who's calling (name and relationship to patient) : Sonita (patient)  Best contact number: [phone]  Provider they see: [facility]   Reason for call: She stated she was calling about back about what the insurance company said about her doing the MRI.  Please call.      PRESCRIPTION REFILL ONLY  Name of prescription:  Pharmacy:

## 2018-11-14 NOTE — Telephone Encounter (Signed)
I called and spoke with Anna Fitzpatrick. She said that she had learned that she has met her deductible and that she wants to have the MRI study ASAP. She feels that she will need sedation to have it done as she will be anxious during the procedure. I told her that I would check in to how long it will take to get her scheduled and call her back. TG

## 2018-11-14 NOTE — Telephone Encounter (Signed)
I called and found that sedation at the hospital will take some time to get scheduled. I called [facility] and they are willing for her to have an oral anxiolytic and have the procedure there. I called [place] and explained this. She agreed with his and wants to proceed. I told her that we will need to get insurance approval first and will let her know about that. I asked her to keep her appointment with me next week. TG

## 2018-11-18 ENCOUNTER — Telehealth: Payer: Self-pay | Admitting: Gastroenterology

## 2018-11-18 DIAGNOSIS — R112 Nausea with vomiting, unspecified: Secondary | ICD-10-CM

## 2018-11-18 NOTE — Telephone Encounter (Signed)
Notes recorded by Milus Banister, MD on 11/18/2018 at 5:17 AM EDT  Anna Fitzpatrick  The biopsies from her stomach show no sign of infection or cancer. Unclear why she has been so nauseas for 2-3 years. Offer adbominal [place] as the next test in workup. Thanks

## 2018-11-18 NOTE — Telephone Encounter (Signed)
You have been scheduled for an abdominal ultrasound at [facility] (1st floor of hospital) on 11/25/18 at 930 am. Please arrive 15 minutes prior to your appointment for registration. Make certain not to have anything to eat or drink 6 hours prior to your appointment. Should you need to reschedule your appointment, please contact radiology at [phone]. This test typically takes about 30 minutes to perform.  Pt notified via My Chart

## 2018-11-20 ENCOUNTER — Other Ambulatory Visit: Payer: Self-pay

## 2018-11-20 ENCOUNTER — Ambulatory Visit (INDEPENDENT_AMBULATORY_CARE_PROVIDER_SITE_OTHER): Payer: PRIVATE HEALTH INSURANCE | Admitting: Family

## 2018-11-20 ENCOUNTER — Encounter (INDEPENDENT_AMBULATORY_CARE_PROVIDER_SITE_OTHER): Payer: Self-pay | Admitting: Family

## 2018-11-20 ENCOUNTER — Telehealth (INDEPENDENT_AMBULATORY_CARE_PROVIDER_SITE_OTHER): Payer: Self-pay | Admitting: Family

## 2018-11-20 VITALS — BP 110/70 | HR 72 | Ht 61.5 in | Wt 157.4 lb

## 2018-11-20 DIAGNOSIS — G43809 Other migraine, not intractable, without status migrainosus: Secondary | ICD-10-CM

## 2018-11-20 DIAGNOSIS — F419 Anxiety disorder, unspecified: Secondary | ICD-10-CM

## 2018-11-20 DIAGNOSIS — G44219 Episodic tension-type headache, not intractable: Secondary | ICD-10-CM

## 2018-11-20 DIAGNOSIS — F411 Generalized anxiety disorder: Secondary | ICD-10-CM | POA: Diagnosis not present

## 2018-11-20 DIAGNOSIS — G43001 Migraine without aura, not intractable, with status migrainosus: Secondary | ICD-10-CM | POA: Diagnosis not present

## 2018-11-20 DIAGNOSIS — Z8249 Family history of ischemic heart disease and other diseases of the circulatory system: Secondary | ICD-10-CM

## 2018-11-20 MED ORDER — ALPRAZOLAM 0.5 MG PO TABS: ORAL_TABLET | ORAL | 0 refills | Status: DC

## 2018-11-20 NOTE — Telephone Encounter (Signed)
Hi [name],   Pt needs an afternoon appt after 3pm. You wanted her to f/u with you in 3 weeks. Is there any where on your schedule where you could possible work her in after 3pm in 3-4 weeks?

## 2018-11-20 NOTE — Telephone Encounter (Signed)
Could she come Weds November 4th at 4:00PM?  Anna Fitzpatrick

## 2018-11-20 NOTE — Progress Notes (Signed)
Anna Fitzpatrick   MRN:  [id number]  02-25-1995   Provider: Elveria Rising NP-C Location of Care: [facility]  Visit type: Follow Up  Last visit: 11/06/2018  Referral source:  History from: patient and [facility] chart  Brief history: History of headaches that began in 2010. She has tension headaches, migraine without aura, ice pick headaches, insomnia and history of tension headaches.She is taking and tolerating Trokendi XR for migraine prevention.  Today's concerns: Anna Fitzpatrick reports today that she continues to have frequent headaches. She says that she typically awakens with a headache of varying severity. She reports having a headache today that is annoying but not severe. With the more severe headaches she has frontal or bi-temporal pain, intolerance to light and noise, and nausea. She also reports a "stinging" sensation in her head as well as vibration in her head that comes and goes. The headaches can last a few hours to a few days.   Anna Fitzpatrick contacted me recently to report that she had learned that she has several family members who have or had aneurysms, with the most recent being a cousin in her 87's. She is understandably concerned about her symptoms being related to possible aneurysm as well. I have referred Anna Fitzpatrick for MRI and MRA of the brain and these imaging studies will be performed on December 06, 2018.   Anna Fitzpatrick has been undergoing gastroenterology evaluation for complaints of frequent nausea and vomiting, and says that thus far all work up has been negative. She continues to have problems with insomnia and has a follow up appointment with Dr Porfirio Mylar Dohmeier for that. She has had problems in the past with hypotension and syncope but has not had problems with this recently.   Anna Fitzpatrick has been otherwise generally healthy since she was last seen. 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 She has no other health concerns today other than previously mentioned.   Review of systems: Please see HPI for neurologic and other pertinent review of systems. Otherwise all other systems were reviewed and were negative.  Problem List: Patient Active Problem List   Diagnosis Date Noted  . Syncope 01/21/2016  . 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 History of hypotension 11/20/2013  . Migraine without aura 08/07/2012  . Episodic tension type headache 08/07/2012     Past Medical History:  Diagnosis Date  . Asthma   . Headache(784.0)   . History of rectal polypectomy 2012    Past medical history comments: See HPI  Surgical history: Past Surgical History:  Procedure Laterality Date  . adenoids   2004   Removed  . COLONOSCOPY     rectal polyp removed  . ear tubes removed  2002 and 2009  . EEG ADULT  10/14/2016      . TYMPANOSTOMY TUBE PLACEMENT  1999     Family history: family history is not on file.   Social history: Social History   Socioeconomic History  . Marital status: Single    Spouse name: Not on file  . Number of children: 0  . Years of education: college  . Highest education level: Not on file  Occupational History  . Occupation: Consulting civil engineer  Social Needs  . Financial resource strain: Not on file  . Food insecurity    Worry: Not on file    Inability: Not on file  . Transportation  needs    Medical: Not on file    Non-medical: Not on file  Tobacco Use  . Smoking status: Never Smoker  . Smokeless tobacco: Never Used  Substance and Sexual Activity  . Alcohol use: No    Alcohol/week: 0.0 standard drinks  . Drug use: No  . Sexual activity: Not Currently    Partners: Male    Birth control/protection: None  Lifestyle  . Physical activity    Days per week: Not on file    Minutes per session: Not on file  . Stress: Not on file  Relationships  . Social Herbalist on phone: Not on file    Gets together: Not on  file    Attends religious service: Not on file    Active member of club or organization: Not on file    Attends meetings of clubs or organizations: Not on file    Relationship status: Not on file  . Intimate partner violence    Fear of current or ex partner: Not on file    Emotionally abused: Not on file    Physically abused: Not on file    Forced sexual activity: Not on file  Other Topics Concern  . 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 Lives with mom Anna Fitzpatrick enjoys reading and working with kids.    Past/failed meds: Topiramate IR - she was concerned with it interfering with birth control  Allergies: Allergies  Allergen Reactions  . Zithromax [Azithromycin] Hives and Rash     Immunizations:  There is no immunization history on file for this patient.    Diagnostics/Screenings:   Physical Exam: BP 110/70   Pulse 72   Ht 5' 1.5" (1.562 m)   Wt 157 lb 6.4 oz (71.4 kg)   LMP 10/29/2018 (Exact Date)   BMI 29.26 kg/m   General: Well developed, well nourished young woman, seated on exam table, in no evident distress, black hair, brown eyes, right handed Head: Head normocephalic and atraumatic.  Oropharynx benign. Neck: Supple with no carotid bruits Cardiovascular: Regular rate and rhythm, no murmurs Respiratory: Breath sounds clear to auscultation Musculoskeletal: No obvious deformities or scoliosis Skin: No rashes or neurocutaneous lesions  Neurologic Exam Mental Status: Awake and fully alert.  Oriented to place and time.  Recent and remote memory intact.  Attention span, concentration, and fund of knowledge appropriate.  Mood and affect appropriate. Cranial Nerves: Fundoscopic exam reveals sharp disc margins.  Pupils equal, briskly reactive to light.  Extraocular movements full without nystagmus.  Visual fields full to confrontation.  Hearing intact and symmetric to finger rub.  Facial sensation intact.  Face tongue,  palate move normally and symmetrically.  Neck flexion and extension normal. Motor: Normal bulk and tone. Normal strength in all tested extremity muscles. Sensory: Intact to touch and temperature in all extremities.  Coordination: Rapid alternating movements normal in all extremities.  Finger-to-nose and heel-to shin performed accurately bilaterally.  Romberg negative. Gait and Station: Arises from chair without difficulty.  Stance is normal. Gait demonstrates normal stride length and balance.   Able to heel, toe and tandem walk without difficulty. Reflexes: 1+ and symmetric. Toes downgoing.  Impression: 1. Migraine without aura  2. Episodic tension headache 3. Migraine variant 4. History of hypotension with episodes of syncope 5. Family history of aneurysm 6. Insomnia  Recommendations for plan of care: The patient's previous [facility] records were reviewed. Khanh has neither  had nor required imaging or lab studies since the last visit, other than what was performed by her gastroenterologist. She is aware of those results. She is a 23 year old young woman with history of migraine and tension headaches, as well as insomnia and family history of aneurysms. She is scheduled for MRI and MRA of the brain on October 30th and I will call her when I receive the results. She has follow up appointment with Dr Porfirio Mylararmen Dohmeier for insomnia. I recommended that she continue on her medication without change for now but that we could consider an injectable migraine treatment such as Aimovig, Ajovy or Emgality after the imaging study results are available. I reminded [name] of the need for her to drink plenty of water, to avoid skipping meals, and to work on getting at least 8 hours of sleep each night. She is anxious about the imaging studies and I gave her a prescription for Alprazolam to take just prior to the procedure. I instructed [name] that she must have a driver to and from the imaging facility if she takes the  Alprazolam for anxiety. We also talked about transition of care to an adult neurology provider, and is interested in referral to Dr Curt BearsLirim Tonuzi with [facility], which I will be happy to do. She also asked for names of other adult neurology providers in [place], which I gave to her. I will see [name] back in follow up in 3 weeks to review the imaging results and make a treatment plan. She agreed with plans made today.   The medication list was reviewed and reconciled. No changes were made in the prescribed medications today. A complete medication list was provided to the patient.  Allergies as of 11/20/2018      Reactions   Zithromax [azithromycin] Hives, Rash      Medication List       Accurate as of November 20, 2018 11:59 PM. If you have any questions, ask your nurse or doctor.        ALPRAZolam 0.5 MG tablet Commonly known as: Xanax Take 1 tablet 30 minutes prior to the procedure and take the 2nd tablet when you arrive for the procedure Started by: Elveria Risingina Marianne Golightly, NP   aspirin-acetaminophen-caffeine [phone] MG tablet Commonly known as: Excedrin Migraine Take 2 tablets at onset of migraine. May repeat in 4-6 hours if needed.   ondansetron 4 MG disintegrating tablet Commonly known as: Zofran ODT Take 1 tablet at onset of nausea. May repeat every 6-8 hours as needed. What changed: additional instructions   pantoprazole 40 MG tablet Commonly known as: PROTONIX TAKE 1 TABLET (40 MG TOTAL) BY MOUTH 2 (TWO) TIMES DAILY BEFORE A MEAL.   SUMAtriptan 25 MG tablet Commonly known as: IMITREX TAKE 1 TABLET BY MOUTH EVERY 2 HOURS AS NEEDED FOR MIGRAINE. MAY REPEAT IN 2 HOURS IF HEADACHE PERSISTS OR RECURS   tiZANidine 4 MG tablet Commonly known as: Zanaflex Take 1 tablet every 6- 8 hours as needed for pain   Trokendi XR 25 MG Cp24 Generic drug: Topiramate ER Take 1 tablet in the morning and take 1 tablet at bedtime       Total time spent with the patient  was 25 minutes, of which 50% or more was spent in counseling and coordination of care.  Elveria Risingina Ellyssa Zagal NP-C [facility] Ph. [phone] Fax [phone]

## 2018-11-20 NOTE — Patient Instructions (Addendum)
Thank you for coming in today.   Instructions for you until your next appointment are as follows: 1. Continue your medications as you have been taking them 2. Please keep your appointment for your MRI study. I will call you when I receive the results 3. Continue to follow up with your gastroenterologist 4. After we receive the MRI results, we can consider other treatments for your headaches such as the new injectable migraine medications. The ones that I would consider for you would be Aimovig, Ajovy and Emgality. These are injections that are done once every 30 days and work to reduce headache frequency and severity.   5. Please keep your appointment with Dr Brett Fairy for your problems with sleep.  6. At your next appointment, we will begin the process for transferring your care to an adult neurology provider. I am happy to refer you to Dr Everette Rank at [facility] as you have requested. Other options would be [facility] or [facility] in [place].  7. Please plan to return for follow up in 3 weeks so that we can review the MRI results and decide on one of the injectable migraine medications.   For your MRI - I have sent in a prescription for Alprazolam 0.5mg . Take 1 tablet 30 minutes prior to the MRI and take 1 tablet when you arrive at the MRI facility. You must have a driver to take you to and from the MRI as this medication can impair your thinking and/or make you sleepy. You should not drive for the remainder of the day that you take the medication.

## 2018-11-20 NOTE — Telephone Encounter (Signed)
Yes she can!! Thanks Otila Kluver!

## 2018-11-21 ENCOUNTER — Encounter (INDEPENDENT_AMBULATORY_CARE_PROVIDER_SITE_OTHER): Payer: Self-pay | Admitting: Family

## 2018-11-25 ENCOUNTER — Ambulatory Visit (HOSPITAL_COMMUNITY): Payer: PRIVATE HEALTH INSURANCE

## 2018-12-04 ENCOUNTER — Encounter: Payer: Self-pay | Admitting: Neurology

## 2018-12-04 ENCOUNTER — Other Ambulatory Visit: Payer: Self-pay

## 2018-12-04 ENCOUNTER — Ambulatory Visit (INDEPENDENT_AMBULATORY_CARE_PROVIDER_SITE_OTHER): Payer: PRIVATE HEALTH INSURANCE | Admitting: Neurology

## 2018-12-04 VITALS — BP 127/60 | HR 80 | Temp 97.8°F | Ht 61.0 in | Wt 161.0 lb

## 2018-12-04 DIAGNOSIS — Z72820 Sleep deprivation: Secondary | ICD-10-CM

## 2018-12-04 DIAGNOSIS — F99 Mental disorder, not otherwise specified: Secondary | ICD-10-CM

## 2018-12-04 DIAGNOSIS — R55 Syncope and collapse: Secondary | ICD-10-CM | POA: Diagnosis not present

## 2018-12-04 DIAGNOSIS — G43809 Other migraine, not intractable, without status migrainosus: Secondary | ICD-10-CM | POA: Diagnosis not present

## 2018-12-04 DIAGNOSIS — G44219 Episodic tension-type headache, not intractable: Secondary | ICD-10-CM | POA: Diagnosis not present

## 2018-12-04 DIAGNOSIS — G4719 Other hypersomnia: Secondary | ICD-10-CM

## 2018-12-04 DIAGNOSIS — G43001 Migraine without aura, not intractable, with status migrainosus: Secondary | ICD-10-CM

## 2018-12-04 DIAGNOSIS — F5105 Insomnia due to other mental disorder: Secondary | ICD-10-CM

## 2018-12-04 NOTE — Patient Instructions (Signed)

## 2018-12-04 NOTE — Progress Notes (Signed)
SLEEP MEDICINE CLINIC    Provider:  Larey Seat, MD     Primary Care Physician:  Dr Gaynell Face, MD        Chief Complaint according to patient   Patient presents with:    . New Patient (Initial Visit)           HISTORY OF PRESENT ILLNESS:  Anna Fitzpatrick is a 23 y.o. year old  African American female patient seen here upon self referral on 12/04/2018 Chief concern according to patient :  " the HST did not work "   have the pleasure of seeing TESSY PAWELSKI today, a right handed Black or [place] American female with a possible sleep disorder.  She has a  has a past medical history of Asthma, Headache(784.0), and History of rectal polypectomy (2012).   The patient had 2 attempts with  HST in 2018, and her insurance did not allow an attended sleep study at the time. She is here to see if we can order the test again as her insurance has changed.   Medical /sleep history:  several types of headaches, insomnia, hypersomnia in daytime .   Social history:  Patient is working as [employer] and lives in a household alone. Pets are ** present. Tobacco use- none .  ETOH use ;socially ,  Caffeine intake in form of Coffee( none ) Soda( 2 cokes a week) Tea ( sometimes ) or energy drinks.     Sleep habits are as follows:  The patient's dinner time is between 6 PM. The patient goes to bed at 9.30 PM and continues to sleep after a latency of several hours -she will sleep  for 1 hour, wake for unknown reasons.  The preferred sleep position is any - waking up supine , with the support of 1 pillow. GERD. Dreams are reportedly rare.  5.30  AM is the usual rise time. The patient wakes up with an alarm.  She reports not feeling refreshed or restored in AM, with symptoms such as dry mouth  morning headaches , and residual fatigue.     Review of Systems: Out of a complete 14 system review, the patient complains of only the following symptoms, and all other reviewed systems are  negative.:  Fatigue, sleepiness , snoring, migraines , sleep deprived , fragmented sleep, Insomnia, nightmares.    How likely are you to doze in the following situations: 0 = not likely, 1 = slight chance, 2 = moderate chance, 3 = high chance   Sitting and Reading? Watching Television? Sitting inactive in a public place (theater or meeting)? As a passenger in a car for an hour without a break? Lying down in the afternoon when circumstances permit? Sitting and talking to someone? Sitting quietly after lunch without alcohol? In a car, while stopped for a few minutes in traffic?   Total = 14/ 24 points   FSS endorsed at 50/ 63 points.   Social History   Socioeconomic History  . Marital status: Single    Spouse name: Not on file  . Number of children: 0  . Years of education: college  . Highest education level: Not on file  Occupational History  . Occupation: Ship broker  Social Needs  . Financial resource strain: Not on file  . Food insecurity    Worry: Not on file    Inability: Not on file  . Transportation needs    Medical: Not on file    Non-medical:  Not on file  Tobacco Use  . Smoking status: Never Smoker  . Smokeless tobacco: Never Used  Substance and Sexual Activity  . Alcohol use: No    Alcohol/week: 0.0 standard drinks  . Drug use: No  . Sexual activity: Not Currently    Partners: Male    Birth control/protection: None  Lifestyle  . Physical activity    Days per week: Not on file    Minutes per session: Not on file  . Stress: Not on file  Relationships  . Social connections    Talks on phone: Not onMusician file    Gets together: Not on file    Attends religious service: Not on file    Active member of club or organization: Not on file    Attends meetings of clubs or organizations: Not on file    Relationship status: Not on file  Other Topics Concern  . 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 Lives with mom  [name] enjoys reading and working with kids.    Family History  Problem Relation Age of Onset  . Stomach cancer Neg Hx   . Esophageal cancer Neg Hx     Past Medical History:  Diagnosis Date  . Asthma   . Headache(784.0)   . History of rectal polypectomy 2012    Past Surgical History:  Procedure Laterality Date  . adenoids   2004   Removed  . COLONOSCOPY     rectal polyp removed  . ear tubes removed  2002 and 2009  . EEG ADULT  10/14/2016      . TYMPANOSTOMY TUBE PLACEMENT  1999     Current Outpatient Medications on File Prior to Visit  Medication Sig Dispense Refill  . aspirin-acetaminophen-caffeine (EXCEDRIN MIGRAINE) 250-250-65 MG tablet Take 2 tablets at onset of migraine. May repeat in 4-6 hours if needed. 30 tablet 0  . SUMAtriptan (IMITREX) 25 MG tablet TAKE 1 TABLET BY MOUTH EVERY 2 HOURS AS NEEDED FOR MIGRAINE. MAY REPEAT IN 2 HOURS IF HEADACHE PERSISTS OR RECURS 8 tablet 0  . tiZANidine (ZANAFLEX) 4 MG tablet Take 1 tablet every 6- 8 hours as needed for pain 20 tablet 0   No current facility-administered medications on file prior to visit.     Allergies  Allergen Reactions  . Zithromax [Azithromycin] Hives and Rash    Physical exam:  Today's Vitals   12/04/18 1433  BP: 127/60  Pulse: 80  Temp: 97.8 F (36.6 C)  Weight: 161 lb (73 kg)  Height: 5\' 1"  (1.549 m)   Body mass index is 30.42 kg/m.   Wt Readings from Last 3 Encounters:  12/04/18 161 lb (73 kg)  11/20/18 157 lb 6.4 oz (71.4 kg)  11/08/18 157 lb (71.2 kg)     Ht Readings from Last 3 Encounters:  12/04/18 5\' 1"  (1.549 m)  11/20/18 5' 1.5" (1.562 m)  11/08/18 5\' 1"  (1.549 m)      General: The patient is awake, alert and appears not in acute distress. The patient is well groomed. Head: Normocephalic, atraumatic. Neck is supple.  Mallampati 2,  neck circumference:13. 75 inches .  Nasal airflow  patent.  Retrognathia is mildy seen.  Dental status:  Intact  Cardiovascular:  Regular  rate and cardiac rhythm by pulse,  without distended neck veins. Respiratory: Lungs are clear to auscultation.  Skin:  Without evidence of ankle edema, or rash. Trunk: The patient's posture is  erect.   Neurologic exam : The patient is awake and alert, oriented to place and time.   Memory subjective described as intact.  Attention span & concentration ability appears normal.  Speech is fluent,  without  dysarthria, dysphonia or aphasia.  Mood and affect are appropriate.   Cranial nerves: no loss of smell or taste reported  Pupils are equal and briskly reactive to light. Funduscopic exam deferred.   Extraocular movements in vertical and horizontal planes were intact and without nystagmus. No Diplopia. Visual fields by finger perimetry are intact. Hearing was intact to soft voice and finger rubbing.  Facial sensation intact to fine touch.  Facial motor strength is symmetric and tongue and uvula move midline.  Neck ROM : rotation, tilt and flexion extension were normal for age and shoulder shrug was symmetrical.    Motor exam:  Symmetric bulk, tone and ROM.   Normal tone without cog wheeling, symmetric grip strength .   Sensory:  Fine touch, pinprick and vibration were tested  and  normal.  Proprioception tested in the upper extremities was normal.   Coordination: Rapid alternating movements in the fingers/hands were of normal speed.  The Finger-to-nose maneuver was intact without evidence of ataxia, dysmetria or tremor.   Gait and station: Patient could rise unassisted from a seated position, walked without assistive device.  Stance is of normal width/ base and the patient turned with 3 steps.  Toe and heel walk were deferred.  Deep tendon reflexes: in the  upper and lower extremities are symmetric and intact.  Babinski response was deferred.       After spending a total time of 35 minutes face to face and additional time for physical and neurologic examination, review of laboratory  studies,  personal review of imaging studies, reports and results of other testing and review of referral information / records as far as provided in visit, I have established the following assessments:  1) as the pleasure of seeing Ms. Prada today again meanwhile 23 years old, right-handed African-American young female with a history of longstanding headaches of multiple qualities.  There is evidence of tension headache as well as of migrainous headaches.  Her primary neurologist has been Dr. Sharene Skeans and nurse practitioner Norwood Levo.  She reports as she did 3 years ago that her sleep time at night is very restricted total sleep time can be 3 to 4 hours rarely accounts to more than 6 hours of sleep.  She has an inability to fall asleep and even trazodone which was recently prescribed has not helped it.   She has frequent migraines which may relate to sleep deprivation.  We are looking for an organic reason for her hypersomnia and daytime as well as for her insomnia at night also chronic insomnia is more likely to be related to depression and anxiety causing racing thoughts and the inability to enter sleep in a relaxed fashion.  She sometimes reports vivid dreams but no hypnagogic hallucinations, no dream intrusion she also has not experienced any cataplexy that would make me order an M SLT test at this time.  She is never fallen asleep when driving a car or operating machinery.    At this time I would like for the patient to have an attended sleep study that he can see what her true sleep time may be.  If there is an early REM sleep onset I would consider working her up for narcolepsy still.  A home sleep test will not be as  likely to give [place] data with a meaningful in the patient reports insomnia without an identified physiological trigger thus far  My Plan is to proceed with:  1) attended sleep study -I would like to thank Elveria Rising  and Dr Sharene Skeans  for allowing me to meet with and to take care  of this pleasant patient.   In short, HALIEGH KHURANA is presenting with headaches and her work up in the sleep clinic has been designated to search for sleep related trigger factors to headaches. I plan to follow up through our NP within 3-4 month.   CC: I will share my notes with PCP .  Electronically signed by: Melvyn Novas, MD 12/04/2018 2:53 PM  [facility] Board certified by The [employer] of Sleep Medicine and Diplomate of the [employer] of Sleep Medicine. Board certified In Neurology through the ABPN, Fellow of the [employer] of Neurology. Medical Director of [facility].

## 2018-12-05 ENCOUNTER — Encounter (INDEPENDENT_AMBULATORY_CARE_PROVIDER_SITE_OTHER): Payer: Self-pay

## 2018-12-06 ENCOUNTER — Other Ambulatory Visit: Payer: PRIVATE HEALTH INSURANCE

## 2018-12-11 ENCOUNTER — Ambulatory Visit (INDEPENDENT_AMBULATORY_CARE_PROVIDER_SITE_OTHER): Payer: Self-pay | Admitting: Family

## 2018-12-17 NOTE — Telephone Encounter (Signed)
FOLLOW UP

## 2019-01-07 ENCOUNTER — Encounter (INDEPENDENT_AMBULATORY_CARE_PROVIDER_SITE_OTHER): Payer: Self-pay

## 2019-01-07 NOTE — Telephone Encounter (Signed)
The referral has already been resent

## 2019-01-07 NOTE — Telephone Encounter (Signed)
Please resend referral via fax to [phone] ASAP.  If the referral is sent today Mylinh will be able to get an appt next week.

## 2019-01-09 ENCOUNTER — Other Ambulatory Visit (INDEPENDENT_AMBULATORY_CARE_PROVIDER_SITE_OTHER): Payer: Self-pay | Admitting: Family

## 2019-01-09 DIAGNOSIS — G43809 Other migraine, not intractable, without status migrainosus: Secondary | ICD-10-CM

## 2019-01-09 DIAGNOSIS — G43001 Migraine without aura, not intractable, with status migrainosus: Secondary | ICD-10-CM

## 2019-02-13 ENCOUNTER — Telehealth: Payer: Self-pay | Admitting: Gastroenterology

## 2019-02-13 DIAGNOSIS — R112 Nausea with vomiting, unspecified: Secondary | ICD-10-CM

## 2019-02-13 NOTE — Telephone Encounter (Signed)
The pt has been advised of the instructions for [place] as well as sent to My Chart You have been scheduled for an abdominal ultrasound at [facility] (1st floor of hospital) on 02/18/19 at 930 am. Please arrive 15 minutes prior to your appointment for registration. Make certain not to have anything to eat or drink 6 hours prior to your appointment. Should you need to reschedule your appointment, please contact radiology at [phone]. This test typically takes about 30 minutes to perform.

## 2019-02-18 ENCOUNTER — Ambulatory Visit (HOSPITAL_COMMUNITY): Payer: PRIVATE HEALTH INSURANCE

## 2019-02-21 ENCOUNTER — Ambulatory Visit (HOSPITAL_COMMUNITY): Payer: PRIVATE HEALTH INSURANCE | Attending: Gastroenterology

## 2019-03-06 ENCOUNTER — Ambulatory Visit (INDEPENDENT_AMBULATORY_CARE_PROVIDER_SITE_OTHER): Payer: BC Managed Care – PPO | Admitting: Neurology

## 2019-03-06 ENCOUNTER — Other Ambulatory Visit: Payer: Self-pay

## 2019-03-06 DIAGNOSIS — G4710 Hypersomnia, unspecified: Secondary | ICD-10-CM | POA: Diagnosis not present

## 2019-03-06 DIAGNOSIS — G43809 Other migraine, not intractable, without status migrainosus: Secondary | ICD-10-CM

## 2019-03-06 DIAGNOSIS — F5105 Insomnia due to other mental disorder: Secondary | ICD-10-CM

## 2019-03-06 DIAGNOSIS — G44219 Episodic tension-type headache, not intractable: Secondary | ICD-10-CM

## 2019-03-06 DIAGNOSIS — F99 Mental disorder, not otherwise specified: Secondary | ICD-10-CM

## 2019-03-06 DIAGNOSIS — R55 Syncope and collapse: Secondary | ICD-10-CM

## 2019-03-06 DIAGNOSIS — G4719 Other hypersomnia: Secondary | ICD-10-CM

## 2019-03-06 DIAGNOSIS — Z72820 Sleep deprivation: Secondary | ICD-10-CM

## 2019-03-06 DIAGNOSIS — G43001 Migraine without aura, not intractable, with status migrainosus: Secondary | ICD-10-CM

## 2019-03-13 ENCOUNTER — Ambulatory Visit: Payer: PRIVATE HEALTH INSURANCE | Admitting: Cardiology

## 2019-03-18 ENCOUNTER — Encounter: Payer: Self-pay | Admitting: Neurology

## 2019-03-18 ENCOUNTER — Other Ambulatory Visit: Payer: Self-pay | Admitting: Neurology

## 2019-03-18 DIAGNOSIS — F99 Mental disorder, not otherwise specified: Secondary | ICD-10-CM | POA: Insufficient documentation

## 2019-03-18 DIAGNOSIS — F5105 Insomnia due to other mental disorder: Secondary | ICD-10-CM | POA: Insufficient documentation

## 2019-03-18 NOTE — Procedures (Signed)
PATIENT'S NAME:  Tessia, Kassin DOB:      January 06, 1996      MR#:    [id number]     DATE OF RECORDING: 03/06/2019 REFERRING M.D.:  Dr. Gaynell Face, MD Study Performed:   Expanded EEG Polysomnogram HISTORY:  CAOILAINN SACKS is a 24 year old African American female patient seen here upon self-referral on 12/04/2018. Chief concern according to patient:  " the HST did not work "   LILIYANA THOBE is a right-handed [place] or [place] American female with a history of Asthma, Headache (784.0), obesity, and History of rectal polypectomy (2012). The patient had 2 attempts with HST in 2018, and her insurance did not allow an attended sleep study at the time. She is here to see if we can order the test again as her insurance has changed.    Medical /sleep history:  several types of headaches, insomnia, hypersomnia in daytime. The patient endorsed the Epworth Sleepiness Scale at 14/24points.  FSS at 50/ 63 points.  The patient's weight 161 pounds with a height of 61 (inches), resulting in a BMI of 30.4 kg/m2. The patient's neck circumference measured 13.8 inches.  CURRENT MEDICATIONS: Excedrin Migraine, Imitrex, Zanaflex   PROCEDURE:  This is a multichannel digital polysomnogram utilizing the Somnostar 11.2 system.  Electrodes and sensors were applied and monitored per AASM Specifications.   EEG, EOG, Chin and Limb EMG, were sampled at 200 Hz.  ECG, Snore and Nasal Pressure, Thermal Airflow, Respiratory Effort, CPAP Flow and Pressure, Oximetry was sampled at 50 Hz. Digital video and audio were recorded.      BASELINE STUDY: Lights Out was at 21:24 and Lights On at 05:00.  Total recording time (TRT) was 456.5 minutes, with a total sleep time (TST) of 399.5 minutes.   The patient's sleep latency was 26.5 minutes.  REM latency was 227 minutes.  The sleep efficiency was 87.5 %.     SLEEP ARCHITECTURE: WASO (Wake after sleep onset) was 30 minutes.  There were 15 minutes in Stage N1, 210.5 minutes Stage N2, 101 minutes  Stage N3 and 73 minutes in Stage REM.  The percentage of Stage N1 was 3.8%, Stage N2 was 52.7%, Stage N3 was 25.3% and Stage R (REM sleep) was 18.3%.   RESPIRATORY ANALYSIS:  There were a total of 0 respiratory events:  0 obstructive apneas, 0 central apneas and 0 mixed apneas with a total of 0 apneas and an apnea index (AI) of 0 /hour. There were 0 hypopneas with a hypopnea index of 0 /hour. The patient also had 0 respiratory event related arousals (RERAs).     The total APNEA/HYPOPNEA INDEX (AHI) was 0/hour and the total RESPIRATORY DISTURBANCE INDEX was  0.0 /hour.  0 events occurred in REM sleep and 0 events in NREM. The REM AHI was  0 /hour, versus a non-REM AHI of 0. The patient spent 84 minutes of total sleep time in the supine position and 316 minutes in non-supine.. The supine AHI was 0.0 versus a non-supine AHI of 0.0.  OXYGEN SATURATION & C02:  The Wake baseline 02 saturation was 98%, with the lowest being 90%. Time spent below 89% saturation equaled 0 minutes.    The arousals were noted as: 21 were spontaneous, 0 were associated with PLMs, 0 were associated with respiratory events. The Periodic Limb Movement (PLM) index was 0 and the PLM Arousal index was 0/hour.  Audio and video analysis did not show any abnormal or unusual movements, behaviors, phonations or vocalizations.  Mild Snoring was noted.  EKG was in keeping with normal sinus rhythm (NSR).  EEG with delta/ alpha intrusion- not epileptiform.   IMPRESSION: No PLMs, no Apnea , no Oxygen Desaturations.   RECOMMENDATIONS:  No physiological sleep disorder was identified. I cannot corelate the headache complaint to the patient's sleep architecture and physiology.    I certify that I have reviewed the entire raw data recording prior to the issuance of this report in accordance with the Standards of Accreditation of the American Academy of Sleep Medicine (AASM)     Melvyn Novas, MD Diplomat, American Board of  Psychiatry and Neurology  Diplomat, American Board of Sleep Medicine Wellsite geologist, [facility] [employer]

## 2019-03-18 NOTE — Progress Notes (Signed)
IMPRESSION: No PLMs, no Apnea , no Oxygen Desaturations.   RECOMMENDATIONS: No follow up needed  No physiological sleep disorder was identified. I cannot corelate the headache complaint to the patient's sleep architecture and physiology.

## 2019-03-21 ENCOUNTER — Ambulatory Visit (INDEPENDENT_AMBULATORY_CARE_PROVIDER_SITE_OTHER): Payer: BC Managed Care – PPO | Admitting: Cardiology

## 2019-03-21 ENCOUNTER — Ambulatory Visit: Payer: PRIVATE HEALTH INSURANCE | Admitting: Cardiology

## 2019-03-21 ENCOUNTER — Other Ambulatory Visit: Payer: Self-pay

## 2019-03-21 ENCOUNTER — Encounter: Payer: Self-pay | Admitting: Cardiology

## 2019-03-21 VITALS — BP 107/71 | HR 71 | Ht 61.5 in | Wt 159.2 lb

## 2019-03-21 DIAGNOSIS — R002 Palpitations: Secondary | ICD-10-CM

## 2019-03-21 DIAGNOSIS — R079 Chest pain, unspecified: Secondary | ICD-10-CM

## 2019-03-21 NOTE — Patient Instructions (Signed)
Medication Instructions:  Your physician recommends that you continue on your current medications as directed. Please refer to the Current Medication list given to you today.  *If you need a refill on your cardiac medications before your next appointment, please call your pharmacy*  Lab Work: None ordered If you have labs (blood work) drawn today and your tests are completely normal, you will receive your results only by: . MyChart Message (if you have MyChart) OR . A paper copy in the mail If you have any lab test that is abnormal or we need to change your treatment, we will call you to review the results.  Testing/Procedures: Your physician has recommended that you wear an zio monitor. Zio monitors are medical devices that record the heart's electrical activity. Doctors most often us these monitors to diagnose arrhythmias. Arrhythmias are problems with the speed or rhythm of the heartbeat. The monitor is a small, portable device. You can wear one while you do your normal daily activities. This is usually used to diagnose what is causing palpitations/syncope (passing out).    Follow-Up: At [facility], you and your health needs are our priority.  As part of our continuing mission to provide you with exceptional heart care, we have created designated Provider Care Teams.  These Care Teams include your primary Cardiologist (physician) and Advanced Practice Providers (APPs -  Physician Assistants and Nurse Practitioners) who all work together to provide you with the care you need, when you need it.  Your next appointment:   4 week(s)  The format for your next appointment:   In Person  Provider:    You may see Brian Agbor-Etang, MD or one of the following Advanced Practice Providers on your designated Care Team:    Christopher Berge, NP  Ryan Dunn, PA-C  Jacquelyn Visser, PA-C   Other Instructions Your physician has recommended that you wear a Zio monitor. This monitor is a  medical device that records the heart's electrical activity. Doctors most often use these monitors to diagnose arrhythmias. Arrhythmias are problems with the speed or rhythm of the heartbeat. The monitor is a small device applied to your chest. You can wear one while you do your normal daily activities. While wearing this monitor if you have any symptoms to push the button and record what you felt. Once you have worn this monitor for the period of time provider prescribed (Usually 14 days), you will return the monitor device in the postage paid box. Once it is returned they will download the data collected and provide us with a report which the provider will then review and we will call you with those results. Important tips:  1. Avoid showering during the first 24 hours of wearing the monitor. 2. Avoid excessive sweating to help maximize wear time. 3. Do not submerge the device, no hot tubs, and no swimming pools. 4. Keep any lotions or oils away from the patch. 5. After 24 hours you may shower with the patch on. Take brief showers with your back facing the shower head.  6. Do not remove patch once it has been placed because that will interrupt data and decrease adhesive wear time. 7. Push the button when you have any symptoms and write down what you were feeling. 8. Once you have completed wearing your monitor, remove and place into box which has postage paid and place in your outgoing mailbox.  9. If for some reason you have misplaced your box then call our office and we   can provide another box and/or mail it off for you.        

## 2019-03-21 NOTE — Progress Notes (Signed)
Cardiology Office Note:    Date:  03/21/2019   ID:  Anna Fitzpatrick, DOB 1995/05/02, MRN [id number]  PCP:  Jarrett Soho, PA-C  Cardiologist:  Debbe Odea, MD  Electrophysiologist:  None   Referring MD: Jarrett Soho, PA-C   Chief Complaint  Patient presents with  . OTHER    Palpitation/elevated HR, sob and chest pressure. Meds reviewed verbally with pt.    Anna Fitzpatrick is a 24 y.o. female who is being seen today for the evaluation of palpitations at the request of Jarrett Soho, [place].   History of Present Illness:    Anna Fitzpatrick is a 24 y.o. female with a hx of asthma, migraines who presents due to palpitations.  Patient states having palpitations over the past 2 weeks start alcohol sporadically.  Her last episode was roughly a week ago where she had on and off palpitations for the entire day.  She checked her watch which had heart rates recordings of up to 145 bpm.  Symptoms are sometimes associated with chest pressure.  She was diagnosed with COVID-19 a month ago.  She otherwise does not have any history of heart disease.  Denies drinking coffee or energy drinks.  Able to do all her activities of daily living without chest pain or shortness of breath.  Past Medical History:  Diagnosis Date  . Asthma   . Headache(784.0)   . History of rectal polypectomy 2012    Past Surgical History:  Procedure Laterality Date  . adenoids   2004   Removed  . COLONOSCOPY     rectal polyp removed  . ear tubes removed  2002 and 2009  . EEG ADULT  10/14/2016      . TYMPANOSTOMY TUBE PLACEMENT  1999    Current Medications: Current Meds  Medication Sig  . albuterol (VENTOLIN HFA) 108 (90 Base) MCG/ACT inhaler Inhale into the lungs every 6 (six) hours as needed for wheezing or shortness of breath.  Marland Kitchen amitriptyline (ELAVIL) 10 MG tablet as directed.  Marland Kitchen aspirin-acetaminophen-caffeine (EXCEDRIN MIGRAINE) 250-250-65 MG tablet Take 2 tablets at onset of migraine. May  repeat in 4-6 hours if needed.  . Multiple Vitamins-Minerals (EMERGEN-C IMMUNE PO) Take by mouth daily.  . Multiple Vitamins-Minerals (WOMENS MULTI PO) Take by mouth daily.  . Rimegepant Sulfate (NURTEC) 75 MG TBDP Take by mouth as needed.     Allergies:   Zithromax [azithromycin]   Social History   Socioeconomic History  . Marital status: Single    Spouse name: Not on file  . Number of children: 0  . Years of education: college  . Highest education level: Not on file  Occupational History  . Occupation: Consulting civil engineer  Tobacco Use  . Smoking status: Never Smoker  . Smokeless tobacco: Never Used  Substance and Sexual Activity  . Alcohol use: Yes    Alcohol/week: 0.0 standard drinks    Comment: occassional  . Drug use: No  . Sexual activity: Not Currently    Partners: Male    Birth control/protection: None  Other Topics Concern  . Not on file  Social History Narrative   Anna Fitzpatrick is employed at a ch[employer]ren's home in [place]. Lives with mom Itzamara enjoys reading and working with kids.   Social Determinants of Health   Financial Resource Strain:   . Difficulty of Paying Living Expenses: Not on file  Food Insecurity:   . Worried About Programme researcher, broadcasting/film/video in the Last Year: Not on file  . Ran Out of  Food in the Last Year: Not on file  Transportation Needs:   . Lack of Transportation (Medical): Not on file  . Lack of Transportation (Non-Medical): Not on file  Physical Activity:   . Days of Exercise per Week: Not on file  . Minutes of Exercise per Session: Not on file  Stress:   . Feeling of Stress : Not on file  Social Connections:   . Frequency of Communication with Friends and Family: Not on file  . Frequency of Social Gatherings with Friends and Family: Not on file  . Attends Religious Services: Not on file  . Active Member of Clubs or Organizations: Not on file  . Attends Banker Meetings: Not on file  . Marital Status: Not on file     Family History: The  patient's family history is negative for Stomach cancer and Esophageal cancer.  ROS:   Please see the history of present illness.     All other systems reviewed and are negative.  EKGs/Labs/Other Studies Reviewed:    The following studies were reviewed today:   EKG:  EKG is  ordered today.  The ekg ordered today demonstrates sinus rhythm, sinus arrhythmia.  Recent Labs: 11/05/2018: ALT 15; BUN 16; Creatinine, Ser 0.91; Hemoglobin 13.1; Platelets 318.0; Potassium 3.6; Sodium 139; TSH 0.75  Recent Lipid Panel No results found for: CHOL, TRIG, HDL, CHOLHDL, VLDL, LDLCALC, LDLDIRECT  Physical Exam:    VS:  BP 107/71 (BP Location: Right Arm, Patient Position: Sitting, Cuff Size: Normal)   Pulse 71   Ht 5' 1.5" (1.562 m)   Wt 159 lb 4 oz (72.2 kg)   SpO2 99%   BMI 29.60 kg/m     Wt Readings from Last 3 Encounters:  03/21/19 159 lb 4 oz (72.2 kg)  12/04/18 161 lb (73 kg)  11/20/18 157 lb 6.4 oz (71.4 kg)     GEN:  Well nourished, well developed in no acute distress HEENT: Normal NECK: No JVD; No carotid bruits LYMPHATICS: No lymphadenopathy CARDIAC: RRR, no murmurs, rubs, gallops RESPIRATORY:  Clear to auscultation without rales, wheezing or rhonchi  ABDOMEN: Soft, non-tender, non-distended MUSCULOSKELETAL:  No edema; No deformity  SKIN: Warm and dry NEUROLOGIC:  Alert and oriented x 3 PSYCHIATRIC:  Normal affect   ASSESSMENT:    1. Palpitations   2. Chest pain of uncertain etiology    PLAN:    In order of problems listed above:  1. Patient with a 2-week history of palpitations.  Heart rate recordings up to 140 bpm.  This could be a sinus tachycardia or another atrial arrhythmia such as supraventricular tachycardia.  We will evaluate patient with a cardiac monitor x2 weeks. 2. Her chest pain symptoms were associated with tachycardia.  She has no risk factors of cardiac disease.  Evaluate with cardiac monitor as mentioned above.  Follow-up after cardiac monitoring 1  month.   Medication Adjustments/Labs and Tests Ordered: Current medicines are reviewed at length with the patient today.  Concerns regarding medicines are outlined above.  Orders Placed This Encounter  Procedures  . LONG TERM MONITOR (3-14 DAYS)  . EKG 12-Lead   No orders of the defined types were placed in this encounter.   Patient Instructions  Medication Instructions:  Your physician recommends that you continue on your current medications as directed. Please refer to the Current Medication list given to you today.  *If you need a refill on your cardiac medications before your next appointment, please call your pharmacy*  Lab Work: None ordered If you have labs (blood work) drawn today and your tests are completely normal, you will receive your results only by: Marland Kitchen MyChart Message (if you have MyChart) OR . A paper copy in the mail If you have any lab test that is abnormal or we need to change your treatment, we will call you to review the results.  Testing/Procedures: Your physician has recommended that you wear an zio monitor. Zio monitors are medical devices that record the heart's electrical activity. Doctors most often [place] these monitors to diagnose arrhythmias. Arrhythmias are problems with the speed or rhythm of the heartbeat. The monitor is a small, portable device. You can wear one while you do your normal daily activities. This is usually used to diagnose what is causing palpitations/syncope (passing out).    Follow-Up: At [facility], you and your health needs are our priority.  As part of our continuing mission to provide you with exceptional heart care, we have created designated Provider Care Teams.  These Care Teams include your primary Cardiologist (physician) and Advanced Practice Providers (APPs -  Physician Assistants and Nurse Practitioners) who all work together to provide you with the care you need, when you need it.  Your next appointment:   4 week(s)   The format for your next appointment:   In Person  Provider:    You may see Kate Sable, MD or one of the following Advanced Practice Providers on your designated Care Team:    Murray Hodgkins, NP  Christell Faith, PA-C  Marrianne Mood, PA-C   Other Instructions Your physician has recommended that you wear a Zio monitor. This monitor is a medical device that records the heart's electrical activity. Doctors most often use these monitors to diagnose arrhythmias. Arrhythmias are problems with the speed or rhythm of the heartbeat. The monitor is a small device applied to your chest. You can wear one while you do your normal daily activities. While wearing this monitor if you have any symptoms to push the button and record what you felt. Once you have worn this monitor for the period of time provider prescribed (Usually 14 days), you will return the monitor device in the postage paid box. Once it is returned they will download the data collected and provide [place] with a report which the provider will then review and we will call you with those results. Important tips:  1. Avoid showering during the first 24 hours of wearing the monitor. 2. Avoid excessive sweating to help maximize wear time. 3. Do not submerge the device, no hot tubs, and no swimming pools. 4. Keep any lotions or oils away from the patch. 5. After 24 hours you may shower with the patch on. Take brief showers with your back facing the shower head.  6. Do not remove patch once it has been placed because that will interrupt data and decrease adhesive wear time. 7. Push the button when you have any symptoms and write down what you were feeling. 8. Once you have completed wearing your monitor, remove and place into box which has postage paid and place in your outgoing mailbox.  9. If for some reason you have misplaced your box then call our office and we can provide another box and/or mail it off for you.           Signed,  Kate Sable, MD  03/21/2019 5:22 PM    [facility]

## 2020-04-07 ENCOUNTER — Other Ambulatory Visit: Payer: Self-pay

## 2020-04-07 ENCOUNTER — Encounter (HOSPITAL_COMMUNITY): Payer: Self-pay

## 2020-04-07 ENCOUNTER — Ambulatory Visit (HOSPITAL_COMMUNITY)
Admission: EM | Admit: 2020-04-07 | Discharge: 2020-04-07 | Disposition: A | Discharge status: Home / Self Care | Payer: 59 | Attending: Family Medicine | Admitting: Family Medicine

## 2020-04-07 DIAGNOSIS — M940 Chondrocostal junction syndrome [Tietze]: Secondary | ICD-10-CM

## 2020-04-07 DIAGNOSIS — R0789 Other chest pain: Secondary | ICD-10-CM

## 2020-04-07 MED ORDER — DICLOFENAC SODIUM 75 MG PO TBEC: 75.0000 mg | DELAYED_RELEASE_TABLET | Freq: Two times a day (BID) | ORAL | 0 refills | Status: AC

## 2020-04-07 NOTE — Discharge Instructions (Signed)
The prescription anti-inflammatory/pain medication given this evening substitutes for any over the counter ibuprofen or naproxen. You may take Tylenol if needed.

## 2020-04-07 NOTE — ED Triage Notes (Signed)
Pt reports pressure and burning in the chest x 4 days, after she swallow a tablet and felt the tablet was stuck in the throat x 1 day. Denies vision changes, headache, dizziness, numbness, weakness.

## 2020-04-08 NOTE — ED Provider Notes (Signed)
[facility]   [id number] 04/07/20 Arrival Time: 1857  ASSESSMENT & PLAN:  1. Chest wall pain   2. Costochondritis     Patient history and exam consistent with non-cardiac cause of chest pain. Conservative measures indicated. Exam consistent with costochondritis. No swallowing difficulties.  See AVS for d/c information.   Begin trial of: Meds ordered this encounter  Medications  . diclofenac (VOLTAREN) 75 MG EC tablet    Sig: Take 1 tablet (75 mg total) by mouth 2 (two) times daily.    Dispense:  20 tablet    Refill:  0     Follow-up Information    [facility].   Why: If worsening or failing to improve as anticipated. Contact information: [address] [place] [address] [phone]              Chest pain precautions given. Reviewed expectations re: course of current medical issues. Questions answered. Outlined signs and symptoms indicating need for more acute intervention. Patient verbalized understanding. After Visit Summary given.   SUBJECTIVE:  History from: patient. Anna Fitzpatrick is a 25 y.o. female who presents with complaint of intermittent upper midline chest discomfort described as dull that does not radiate. Onset gradual, first noted first noted 3-4 d ago. Reports these symptoms are unchanged since beginning. Without associated n/v; without associated SOB. Injury or recent strenuous activity: none. Does report symptoms started after "swallowed a pill wrong; felt it was stuck in my throat". CP is worse with bending/movement. Denies: fatigue, irregular heart beat, near-syncope, orthopnea, palpitations, paroxysmal nocturnal dyspnea and syncope. Recent illnesses: none. Fever: absent. Ambulatory without assistance. Self/OTC treatment: none. History of similar: no. Illicit drug use: none.  Social History   Tobacco Use  Smoking Status Never Smoker  Smokeless Tobacco Never Used    Social History   Substance and Sexual Activity  Alcohol Use Yes  . Alcohol/week: 0.0 standard drinks   Comment: occassional    OBJECTIVE:  Vitals:   04/07/20 1921  BP: 117/76  Pulse: 99  Resp: 20  Temp: 98 F (36.7 C)  TempSrc: Oral  SpO2: 100%    General appearance: alert, oriented, no acute distress Eyes: PERRLA; EOMI; conjunctivae normal HENT: normocephalic; atraumatic Neck: supple with FROM Lungs: without labored respirations; speaks full sentences without difficulty; CTAB Heart: regular rate and rhythm without murmer Chest Wall: with significant tenderness to palpation over upper chest Abdomen: soft, non-tender; no guarding or rebound tenderness Extremities: without edema; without calf swelling or tenderness; symmetrical without gross deformities Skin: warm and dry; without rash or lesions Neuro: normal gait Psychological: alert and cooperative; normal mood and affect   Allergies  Allergen Reactions  . Zithromax [Azithromycin] Hives and Rash  . Etonogestrel Itching    Past Medical History:  Diagnosis Date  . Asthma   . Headache(784.0)   . History of rectal polypectomy 2012   Social History   Socioeconomic History  . Marital status: Single    Spouse name: Not on file  . Number of children: 0  . Years of education: college  . Highest education level: Not on file  Occupational History  . Occupation: Consulting civil engineer  Tobacco Use  . Smoking status: Never Smoker  . Smokeless tobacco: Never Used  Vaping Use  . Vaping Use: Never used  Substance and Sexual Activity  . Alcohol use: Yes    Alcohol/week: 0.0 standard drinks    Comment: occassional  . Drug use: No  .  Sexual activity: Not Currently    Partners: Male    Birth control/protection: None  Other Topics Concern  . Not on file  Social History Narrative   [name] is employed at a ch[employer]idren's home in [place]. Lives with mom Tannia enjoys reading and working with kids.   Social Determinants of Health    Financial Resource Strain: Not on file  Food Insecurity: Not on file  Transportation Needs: Not on file  Physical Activity: Not on file  Stress: Not on file  Social Connections: Not on file  Intimate Partner Violence: Not on file   Family History  Problem Relation Age of Onset  . Stomach cancer Neg Hx   . Esophageal cancer Neg Hx    Past Surgical History:  Procedure Laterality Date  . adenoids   2004   Removed  . COLONOSCOPY     rectal polyp removed  . ear tubes removed  2002 and 2009  . EEG ADULT  10/14/2016      . TYMPANOSTOMY TUBE PLACEMENT  1999     Mardella Layman, MD 04/08/20 [phone]

## 2020-06-13 ENCOUNTER — Encounter (INDEPENDENT_AMBULATORY_CARE_PROVIDER_SITE_OTHER): Payer: Self-pay

## 2020-09-08 DIAGNOSIS — G43719 Chronic migraine without aura, intractable, without status migrainosus: Secondary | ICD-10-CM | POA: Diagnosis not present

## 2020-12-09 DIAGNOSIS — G43009 Migraine without aura, not intractable, without status migrainosus: Secondary | ICD-10-CM | POA: Diagnosis not present

## 2020-12-09 DIAGNOSIS — G4700 Insomnia, unspecified: Secondary | ICD-10-CM | POA: Diagnosis not present

## 2020-12-21 DIAGNOSIS — R110 Nausea: Secondary | ICD-10-CM | POA: Diagnosis not present

## 2021-02-09 DIAGNOSIS — J039 Acute tonsillitis, unspecified: Secondary | ICD-10-CM | POA: Diagnosis not present

## 2021-02-10 DIAGNOSIS — J039 Acute tonsillitis, unspecified: Secondary | ICD-10-CM | POA: Diagnosis not present

## 2021-04-06 DIAGNOSIS — G43009 Migraine without aura, not intractable, without status migrainosus: Secondary | ICD-10-CM | POA: Diagnosis not present

## 2021-04-06 DIAGNOSIS — G4700 Insomnia, unspecified: Secondary | ICD-10-CM | POA: Diagnosis not present

## 2021-05-09 ENCOUNTER — Other Ambulatory Visit: Payer: Self-pay | Admitting: Obstetrics and Gynecology

## 2021-05-09 ENCOUNTER — Other Ambulatory Visit (HOSPITAL_COMMUNITY)
Admission: RE | Admit: 2021-05-09 | Discharge: 2021-05-09 | Disposition: A | Discharge status: Home / Self Care | Payer: BC Managed Care – PPO | Source: Ambulatory Visit | Attending: Obstetrics and Gynecology | Admitting: Obstetrics and Gynecology

## 2021-05-09 DIAGNOSIS — Z113 Encounter for screening for infections with a predominantly sexual mode of transmission: Secondary | ICD-10-CM | POA: Diagnosis not present

## 2021-05-09 DIAGNOSIS — Z01419 Encounter for gynecological examination (general) (routine) without abnormal findings: Secondary | ICD-10-CM | POA: Insufficient documentation

## 2021-05-09 DIAGNOSIS — N946 Dysmenorrhea, unspecified: Secondary | ICD-10-CM | POA: Diagnosis not present

## 2021-05-09 DIAGNOSIS — N898 Other specified noninflammatory disorders of vagina: Secondary | ICD-10-CM | POA: Diagnosis not present

## 2021-05-10 LAB — CYTOLOGY - PAP: Diagnosis: NEGATIVE

## 2021-05-20 DIAGNOSIS — G43001 Migraine without aura, not intractable, with status migrainosus: Secondary | ICD-10-CM | POA: Diagnosis not present

## 2021-05-31 DIAGNOSIS — G4700 Insomnia, unspecified: Secondary | ICD-10-CM | POA: Diagnosis not present

## 2021-05-31 DIAGNOSIS — G43001 Migraine without aura, not intractable, with status migrainosus: Secondary | ICD-10-CM | POA: Diagnosis not present

## 2021-05-31 DIAGNOSIS — Z79899 Other long term (current) drug therapy: Secondary | ICD-10-CM | POA: Diagnosis not present

## 2021-06-07 DIAGNOSIS — Z111 Encounter for screening for respiratory tuberculosis: Secondary | ICD-10-CM | POA: Diagnosis not present

## 2021-06-07 DIAGNOSIS — Z0000 Encounter for general adult medical examination without abnormal findings: Secondary | ICD-10-CM | POA: Diagnosis not present

## 2021-06-09 DIAGNOSIS — A184 Tuberculosis of skin and subcutaneous tissue: Secondary | ICD-10-CM | POA: Diagnosis not present

## 2021-11-21 DIAGNOSIS — G43001 Migraine without aura, not intractable, with status migrainosus: Secondary | ICD-10-CM | POA: Diagnosis not present

## 2021-11-23 DIAGNOSIS — G43009 Migraine without aura, not intractable, without status migrainosus: Secondary | ICD-10-CM | POA: Diagnosis not present

## 2021-11-23 DIAGNOSIS — G4700 Insomnia, unspecified: Secondary | ICD-10-CM | POA: Diagnosis not present

## 2021-11-23 DIAGNOSIS — R202 Paresthesia of skin: Secondary | ICD-10-CM | POA: Diagnosis not present

## 2021-11-23 DIAGNOSIS — M542 Cervicalgia: Secondary | ICD-10-CM | POA: Diagnosis not present

## 2021-11-25 DIAGNOSIS — G43001 Migraine without aura, not intractable, with status migrainosus: Secondary | ICD-10-CM | POA: Diagnosis not present

## 2021-12-16 DIAGNOSIS — R519 Headache, unspecified: Secondary | ICD-10-CM | POA: Diagnosis not present

## 2021-12-16 DIAGNOSIS — G43001 Migraine without aura, not intractable, with status migrainosus: Secondary | ICD-10-CM | POA: Diagnosis not present

## 2021-12-16 DIAGNOSIS — R202 Paresthesia of skin: Secondary | ICD-10-CM | POA: Diagnosis not present

## 2022-07-17 DIAGNOSIS — G43001 Migraine without aura, not intractable, with status migrainosus: Secondary | ICD-10-CM | POA: Diagnosis not present

## 2022-07-29 ENCOUNTER — Ambulatory Visit
Admission: EM | Admit: 2022-07-29 | Discharge: 2022-07-29 | Disposition: A | Discharge status: Home / Self Care | Payer: BC Managed Care – PPO | Attending: Emergency Medicine | Admitting: Emergency Medicine

## 2022-07-29 ENCOUNTER — Encounter: Payer: Self-pay | Admitting: Emergency Medicine

## 2022-07-29 ENCOUNTER — Other Ambulatory Visit: Payer: Self-pay

## 2022-07-29 ENCOUNTER — Ambulatory Visit (INDEPENDENT_AMBULATORY_CARE_PROVIDER_SITE_OTHER): Payer: BC Managed Care – PPO

## 2022-07-29 DIAGNOSIS — J309 Allergic rhinitis, unspecified: Secondary | ICD-10-CM | POA: Diagnosis not present

## 2022-07-29 DIAGNOSIS — J4520 Mild intermittent asthma, uncomplicated: Secondary | ICD-10-CM

## 2022-07-29 DIAGNOSIS — R112 Nausea with vomiting, unspecified: Secondary | ICD-10-CM | POA: Diagnosis not present

## 2022-07-29 DIAGNOSIS — K5904 Chronic idiopathic constipation: Secondary | ICD-10-CM

## 2022-07-29 DIAGNOSIS — K2100 Gastro-esophageal reflux disease with esophagitis, without bleeding: Secondary | ICD-10-CM

## 2022-07-29 LAB — POCT URINALYSIS DIP (MANUAL ENTRY)
Bilirubin, UA: NEGATIVE
Blood, UA: NEGATIVE
Glucose, UA: NEGATIVE mg/dL
Ketones, POC UA: NEGATIVE mg/dL
Leukocytes, UA: NEGATIVE
Nitrite, UA: NEGATIVE
Protein Ur, POC: NEGATIVE mg/dL
Spec Grav, UA: 1.02 (ref 1.010–1.025)
Urobilinogen, UA: 1 E.U./dL
pH, UA: 6 (ref 5.0–8.0)

## 2022-07-29 LAB — POCT URINE PREGNANCY: Preg Test, Ur: NEGATIVE

## 2022-07-29 MED ORDER — PROMETHAZINE HCL 25 MG PO TABS: 25.0000 mg | ORAL_TABLET | Freq: Three times a day (TID) | ORAL | 0 refills | Status: AC | PRN

## 2022-07-29 MED ORDER — ALBUTEROL SULFATE HFA 108 (90 BASE) MCG/ACT IN AERS: 2.0000 | INHALATION_SPRAY | Freq: Four times a day (QID) | RESPIRATORY_TRACT | 2 refills | Status: AC | PRN

## 2022-07-29 MED ORDER — TRULANCE 3 MG PO TABS: 3.0000 mg | ORAL_TABLET | Freq: Every morning | ORAL | 2 refills | Status: AC

## 2022-07-29 MED ORDER — FEXOFENADINE HCL 180 MG PO TABS: 180.0000 mg | ORAL_TABLET | Freq: Every day | ORAL | 1 refills | Status: AC

## 2022-07-29 MED ORDER — PANTOPRAZOLE SODIUM 40 MG PO TBEC: 40.0000 mg | DELAYED_RELEASE_TABLET | Freq: Every day | ORAL | 2 refills | Status: AC

## 2022-07-29 MED ORDER — AZELASTINE-FLUTICASONE 137-50 MCG/ACT NA SUSP: 1.0000 | Freq: Two times a day (BID) | NASAL | 2 refills | Status: AC

## 2022-07-29 NOTE — Discharge Instructions (Addendum)
Please read below to learn more about the medications, dosages and frequencies that I recommend to help alleviate your symptoms and to get you feeling better soon:   Trulance (plecanatide tied): This is a unique medication commonly prescribed to relieve severe, chronic constipation.  It works by pulling fluid into the bowels and into your stool increasing stool volume and making stool easier to pass.  It is typically prescribed to be taken daily but some patients find they prefer to take it every other day or every third day depending on how well it is tolerated.  I recommend that for your first dose, you plan to take Trulance on a day that you will be at home.    It appears that Trulance will be covered by your insurance but in the event that it is not, I recommend that you also take a bottle of mag citrate to clear your bowels and begin daily dose of MiraLAX.  Your insurance does not cover MiraLAX.  I also recommend that you take an over-the-counter fiber supplement containing "psyllium" twice daily and be sure that you are drinking 80 to 100 ounces of water daily in addition to your very healthy diet.    Promethazine: Promethazine is an antinausea medication.  Promethazine often makes most patients feel fairly sleepy.  You can take this medication up to 3 times daily as needed for nausea.  Protonix (pantoprazole): This medication is called a proton pump inhibitor (PPI).  When taken regularly, it reduces the amount of acid that your stomach produces thereby reducing symptoms of heartburn and allowing irritation in the stomach to heal more quickly.  This medication does not cure reflux but prevents the contents of reflux from irritating your esophagus which is what causes heartburn.  This medication works best when taken exactly as prescribed, not "as needed".   Allegra (fexofenadine): This is an excellent second-generation antihistamine that helps to reduce respiratory inflammatory response to  environmental allergens.  This medication is not known to cause daytime sleepiness so it can be taken in the daytime.  If you find that it does make you sleepy, please feel free to take it at bedtime.   Dymista (fluticasone and azelastine): This is a combination nasal spray that contains both a nasal steroid and nasal antihistamine.  Please use 1 spray in each nare twice daily or every 12 hours.  This medication works best when it is used regularly, not "as needed".  You may find that it takes a few days for this to reach full effectiveness, please be patient with his onboarding process.  The most common side effect of this medication is nosebleeds.  Please discontinue use for 1 week if this occurs, then resume.   ProAir, Ventolin, Proventil (albuterol): This inhaled medication contains a short acting beta agonist bronchodilator.  This medication relaxes the smooth muscle of the airway in the lungs.  When these muscles are tight, breathing becomes more constricted.  The result of relaxation of the smooth muscle is increased air movement and improved work of breathing.  This is a short acting medication that can be used every 4-6 hours as needed for increased work of breathing, shortness of breath, wheezing and excessive coughing.  It comes in the form of a handheld inhaler or nebulizer solution.  I recommended that for the next 3 to 4 days, this medication is used 4 times daily on a scheduled basis then decrease to twice daily and as needed until symptoms have completely resolved which I  anticipate will be several weeks.   If symptoms have not meaningfully improved in the next 3 to 5 days, please return for repeat evaluation or follow-up with your regular provider.  If symptoms have worsened in the next 3 to 5 days, please return for repeat evaluation or follow-up with your regular provider.   I have included contact information for 2 gastroenterologists in [place], unfortunately I do not know any  gastroenterologist in this area that you are welcome to reach out to your health insurance company to see who is included in your network.   Thank you for visiting urgent care today.  We appreciate the opportunity to participate in your care.

## 2022-07-29 NOTE — ED Provider Notes (Addendum)
[name]    CSN: [id number] Arrival date & time: 07/29/22  1140    HISTORY   Chief Complaint  Patient presents with   Nausea   HPI Anna Fitzpatrick is a pleasant, 27 y.o. female who presents to urgent care today. Pt sts nausea with vomiting of most foods that she eats x 3 weeks, states sometimes she is nauseated when she is not eating as well.  Patient denies focal abdominal pain or generalized abdominal pain.  Patient states vomiting makes her belly hurt.  Patient reports negative home pregnancy test, pregnancy test during her visit today was negative as was her urine dip.  Patient states she has been taking ondansetron that she had at home, leftover from a previous episode of nausea and vomiting, states this has not been helping.  Patient also complains of symptoms of worsening allergic rhinitis, postnasal drip.  Patient is also requesting a refill of her albuterol inhaler, denies shortness of breath, cough, wheezing at this time.  The history is provided by the patient.   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    Home Medications    Prior to Admission medications   Medication Sig Start Date End Date Taking? Authorizing Provider  albuterol (VENTOLIN HFA) 108 (90 Base) MCG/ACT inhaler Inhale into the lungs every 6 (six) hours as needed  for wheezing or shortness of breath.    [provider]  amitriptyline (ELAVIL) 10 MG tablet as directed. 02/13/19   [provider]  diclofenac (VOLTAREN) 75 MG EC tablet Take 1 tablet (75 mg total) by mouth 2 (two) times daily. 04/07/20   Mardella Layman, MD  gabapentin (NEURONTIN) 300 MG capsule Take by mouth. 03/31/20   [provider]    Family History Family History  Problem Relation Age of Onset   Stomach cancer Neg Hx    Esophageal cancer Neg Hx    Social History Social History   Tobacco Use   Smoking status: Never   Smokeless tobacco: Never  Vaping Use   Vaping Use: Never used  Substance Use Topics   Alcohol use: Yes    Alcohol/week: 0.0 standard drinks of alcohol    Comment: occassional   Drug use: No   Allergies   Zithromax [azithromycin] and Etonogestrel  Review of Systems Review of Systems Pertinent findings revealed after performing a 14 point review of systems has been noted in the history of present illness.  Physical Exam Vital Signs BP 119/82 (BP Location: Left Arm)   Pulse 82   Temp 99.3 F (37.4 C) (Oral)   Resp 18   SpO2 99%   No data found.  Physical Exam Vitals and nursing note reviewed.  Constitutional:  General: She is not in acute distress.    Appearance: Normal appearance. She is not ill-appearing.  HENT:     Head: Normocephalic and atraumatic.     Salivary Glands: Right salivary gland is not diffusely enlarged or tender. Left salivary gland is not diffusely enlarged or tender.     Right Ear: Ear canal and external ear normal. No drainage. A middle ear effusion is present. There is no impacted cerumen. Tympanic membrane is bulging. Tympanic membrane is not injected or erythematous.     Left Ear: Ear canal and external ear normal. No drainage. A middle ear effusion is present. There is no impacted cerumen. Tympanic membrane is bulging. Tympanic membrane is not injected or erythematous.     Ears:     Comments:  Bilateral EACs normal, both TMs bulging with clear fluid    Nose: Rhinorrhea present. No nasal deformity, septal deviation, signs of injury, nasal tenderness, mucosal edema or congestion. Rhinorrhea is clear.     Right Nostril: Occlusion present. No foreign body, epistaxis or septal hematoma.     Left Nostril: Occlusion present. No foreign body, epistaxis or septal hematoma.     Right Turbinates: Enlarged, swollen and pale.     Left Turbinates: Enlarged, swollen and pale.     Right Sinus: No maxillary sinus tenderness or frontal sinus tenderness.     Left Sinus: No maxillary sinus tenderness or frontal sinus tenderness.     Mouth/Throat:     Lips: Pink. No lesions.     Mouth: Mucous membranes are moist. No oral lesions.     Pharynx: Oropharynx is clear. Uvula midline. No posterior oropharyngeal erythema or uvula swelling.     Tonsils: No tonsillar exudate. 0 on the right. 0 on the left.     Comments: Postnasal drip Eyes:     General: Lids are normal.        Right eye: No discharge.        Left eye: No discharge.     Extraocular Movements: Extraocular movements intact.     Conjunctiva/sclera: Conjunctivae normal.     Right eye: Right conjunctiva is not injected.     Left eye: Left conjunctiva is not injected.  Neck:     Trachea: Trachea and phonation normal.  Cardiovascular:     Rate and Rhythm: Normal rate and regular rhythm.     Pulses: Normal pulses.     Heart sounds: Normal heart sounds. No murmur heard.    No friction rub. No gallop.  Pulmonary:     Effort: Pulmonary effort is normal. No accessory muscle usage, prolonged expiration or respiratory distress.     Breath sounds: Normal breath sounds. No stridor, decreased air movement or transmitted upper airway sounds. No decreased breath sounds, wheezing, rhonchi or rales.  Chest:     Chest wall: No tenderness.  Abdominal:     General: Abdomen is protuberant. Bowel sounds are decreased.     Palpations: Abdomen is soft.      Tenderness: There is generalized abdominal tenderness.  Musculoskeletal:        General: Normal range of motion.     Cervical back: Normal range of motion and neck supple. Normal range of motion.  Lymphadenopathy:     Cervical: No cervical adenopathy.  Skin:    General: Skin is warm and dry.     Findings: No erythema or rash.  Neurological:     General: No focal deficit present.     Mental Status: She is  alert and oriented to person, place, and time.  Psychiatric:        Mood and Affect: Mood normal.        Behavior: Behavior normal.     Visual Acuity Right Eye Distance:   Left Eye Distance:   Bilateral Distance:    Right Eye Near:   Left Eye Near:    Bilateral Near:     UC Couse / Diagnostics / Procedures:     Radiology DG Abd 1 View  Result Date: 07/29/2022 CLINICAL DATA:  Nausea, vomiting for 3 weeks. History of constipation. EXAM: ABDOMEN - 1 VIEW COMPARISON:  None Available. FINDINGS: The bowel gas pattern is nonobstructive. No dilated large or small bowel loops. Mild stool burden noted within the colon and rectum. No abnormal abdominal or pelvic calcifications. Osseous structures appear intact. IMPRESSION: Nonobstructive bowel gas pattern. Electronically Signed   By: Signa Kell M.D.   On: 07/29/2022 12:44    Procedures Procedures (including critical care time) EKG  Pending results:  Labs Reviewed  POCT URINALYSIS DIP (MANUAL ENTRY) - Abnormal; Notable for the following components:      Result Value   Color, UA light yellow (*)    All other components within normal limits  POCT URINE PREGNANCY    Medications Ordered in UC: Medications - No data to display  UC Diagnoses / Final Clinical Impressions(s)   I have reviewed the triage vital signs and the nursing notes.  Pertinent labs & imaging results that were available during my care of the patient were reviewed by me and considered in my medical decision making (see chart for details).    Final  diagnoses:  Nausea and vomiting, unspecified vomiting type  Chronic idiopathic constipation  Allergic rhinitis, unspecified seasonality, unspecified trigger  Gastroesophageal reflux disease with esophagitis without hemorrhage  Mild intermittent asthma, uncomplicated  Urine pregnancy test today is negative, urinalysis was unremarkable. Patient advised of abdominal film x-ray findings, images shared with patient.  Recommend patient begin Trulance daily, prescription sent to pharmacy.  Patient advised that if prescription is not covered by her insurance, she can certainly take MiraLAX once daily and a fiber supplement twice daily and ensure that she is drinking between 80 and 100 ounces of water every day.  Patient also provided with contact information for local gastroenterologist given that she has been referred a few years ago but never went due to insurance changing and being in school.    Patient provided with promethazine for relief of nausea and Protonix for relief of esophageal discomfort secondary to likely gastroesophageal reflux caused by constipation.  Patient provided with prescriptions for fexofenadine and Dymista nasal spray for relief of allergy symptoms.  Please see discharge instructions below for details of plan of care as provided to patient. ED Prescriptions     Medication Sig Dispense Auth. Provider   Plecanatide (TRULANCE) 3 MG TABS Take 1 tablet (3 mg total) by mouth in the morning. 30 tablet Theadora Rama Scales, PA-C   albuterol (VENTOLIN HFA) 108 (90 Base) MCG/ACT inhaler Inhale 2 puffs into the lungs every 6 (six) hours as needed for wheezing or shortness of breath (Cough). 18 g Theadora Rama Scales, PA-C   fexofenadine (ALLEGRA) 180 MG tablet Take 1 tablet (180 mg total) by mouth daily. 90 tablet Theadora Rama Scales, PA-C   Azelastine-Fluticasone [facility]) 137-50 MCG/ACT SUSP Place 1 spray into the nose every 12 (twelve) hours. 23 g Theadora Rama Scales, PA-C    pantoprazole (PROTONIX) 40 MG tablet  Take 1 tablet (40 mg total) by mouth daily. 30 tablet Theadora Rama Scales, PA-C   promethazine (PHENERGAN) 25 MG tablet Take 1 tablet (25 mg total) by mouth every 8 (eight) hours as needed for up to 7 days for nausea or vomiting. 21 tablet Theadora Rama Scales, PA-C      PDMP not reviewed this encounter.  Pending results:  Labs Reviewed  POCT URINALYSIS DIP (MANUAL ENTRY) - Abnormal; Notable for the following components:      Result Value   Color, UA light yellow (*)    All other components within normal limits  POCT URINE PREGNANCY    Discharge Instructions:   Discharge Instructions      Please read below to learn more about the medications, dosages and frequencies that I recommend to help alleviate your symptoms and to get you feeling better soon:   Trulance (plecanatide tied): This is a unique medication commonly prescribed to relieve severe, chronic constipation.  It works by pulling fluid into the bowels and into your stool increasing stool volume and making stool easier to pass.  It is typically prescribed to be taken daily but some patients find they prefer to take it every other day or every third day depending on how well it is tolerated.  I recommend that for your first dose, you plan to take Trulance on a day that you will be at home.    It appears that Trulance will be covered by your insurance but in the event that it is not, I recommend that you also take a bottle of mag citrate to clear your bowels and begin daily dose of MiraLAX.  Your insurance does not cover MiraLAX.  I also recommend that you take an over-the-counter fiber supplement containing "psyllium" twice daily and be sure that you are drinking 80 to 100 ounces of water daily in addition to your very healthy diet.    Promethazine: Promethazine is an antinausea medication.  Promethazine often makes most patients feel fairly sleepy.  You can take this medication up to 3  times daily as needed for nausea.  Protonix (pantoprazole): This medication is called a proton pump inhibitor (PPI).  When taken regularly, it reduces the amount of acid that your stomach produces thereby reducing symptoms of heartburn and allowing irritation in the stomach to heal more quickly.  This medication does not cure reflux but prevents the contents of reflux from irritating your esophagus which is what causes heartburn.  This medication works best when taken exactly as prescribed, not "as needed".   Allegra (fexofenadine): This is an excellent second-generation antihistamine that helps to reduce respiratory inflammatory response to environmental allergens.  This medication is not known to cause daytime sleepiness so it can be taken in the daytime.  If you find that it does make you sleepy, please feel free to take it at bedtime.   Dymista (fluticasone and azelastine): This is a combination nasal spray that contains both a nasal steroid and nasal antihistamine.  Please use 1 spray in each nare twice daily or every 12 hours.  This medication works best when it is used regularly, not "as needed".  You may find that it takes a few days for this to reach full effectiveness, please be patient with his onboarding process.  The most common side effect of this medication is nosebleeds.  Please discontinue use for 1 week if this occurs, then resume.   ProAir, Ventolin, Proventil (albuterol): This inhaled medication contains a short acting beta  agonist bronchodilator.  This medication relaxes the smooth muscle of the airway in the lungs.  When these muscles are tight, breathing becomes more constricted.  The result of relaxation of the smooth muscle is increased air movement and improved work of breathing.  This is a short acting medication that can be used every 4-6 hours as needed for increased work of breathing, shortness of breath, wheezing and excessive coughing.  It comes in the form of a handheld  inhaler or nebulizer solution.  I recommended that for the next 3 to 4 days, this medication is used 4 times daily on a scheduled basis then decrease to twice daily and as needed until symptoms have completely resolved which I anticipate will be several weeks.   If symptoms have not meaningfully improved in the next 3 to 5 days, please return for repeat evaluation or follow-up with your regular provider.  If symptoms have worsened in the next 3 to 5 days, please return for repeat evaluation or follow-up with your regular provider.   I have included contact information for 2 gastroenterologists in [place], unfortunately I do not know any gastroenterologist in this area that you are welcome to reach out to your health insurance company to see who is included in your network.   Thank you for visiting urgent care today.  We appreciate the opportunity to participate in your care.       Disposition Upon Discharge:  Condition: stable for discharge home  Patient presented with an acute illness with associated systemic symptoms and significant discomfort requiring urgent management. In my opinion, this is a condition that a prudent lay person (someone who possesses an average knowledge of health and medicine) may potentially expect to result in complications if not addressed urgently such as respiratory distress, impairment of bodily function or dysfunction of bodily organs.   Routine symptom specific, illness specific and/or disease specific instructions were discussed with the patient and/or caregiver at length.   As such, the patient has been evaluated and assessed, work-up was performed and treatment was provided in alignment with urgent care protocols and evidence based medicine.  Patient/parent/caregiver has been advised that the patient may require follow up for further testing and treatment if the symptoms continue in spite of treatment, as clinically indicated and  appropriate.  Patient/parent/caregiver has been advised to return to the [facility] ([place]) or PCP if no better; to PCP or the Emergency Department if new signs and symptoms develop, or if the current signs or symptoms continue to change or worsen for further workup, evaluation and treatment as clinically indicated and appropriate  The patient will follow up with their current PCP if and as advised. If the patient does not currently have a PCP we will assist them in obtaining one.   The patient may need specialty follow up if the symptoms continue, in spite of conservative treatment and management, for further workup, evaluation, consultation and treatment as clinically indicated and appropriate.  Patient/parent/caregiver verbalized understanding and agreement of plan as discussed.  All questions were addressed during visit.  Please see discharge instructions below for further details of plan.  This office note has been dictated using Teaching laboratory technician.  Unfortunately, this method of dictation can sometimes lead to typographical or grammatical errors.  I apologize for your inconvenience in advance if this occurs.  Please do not hesitate to reach out to me if clarification is needed.      Theadora Rama Scales, PA-C 07/29/22 1439    Lequita Halt,  Lillia Abed Scales, PA-C 07/29/22 1446

## 2022-07-29 NOTE — ED Triage Notes (Signed)
Pt sts nausea with vomiting x 3 weeks; sts took hpt was negative; pt sts taking some nausea meds she had at home without relief

## 2022-08-04 DIAGNOSIS — R110 Nausea: Secondary | ICD-10-CM | POA: Diagnosis not present

## 2022-08-04 DIAGNOSIS — K5901 Slow transit constipation: Secondary | ICD-10-CM | POA: Diagnosis not present

## 2022-08-31 ENCOUNTER — Emergency Department (HOSPITAL_BASED_OUTPATIENT_CLINIC_OR_DEPARTMENT_OTHER): Payer: BC Managed Care – PPO

## 2022-08-31 ENCOUNTER — Other Ambulatory Visit: Payer: Self-pay

## 2022-08-31 ENCOUNTER — Encounter (HOSPITAL_BASED_OUTPATIENT_CLINIC_OR_DEPARTMENT_OTHER): Payer: Self-pay | Admitting: Emergency Medicine

## 2022-08-31 ENCOUNTER — Emergency Department (HOSPITAL_BASED_OUTPATIENT_CLINIC_OR_DEPARTMENT_OTHER)
Admission: EM | Admit: 2022-08-31 | Discharge: 2022-08-31 | Disposition: A | Discharge status: Home / Self Care | Payer: BC Managed Care – PPO | Attending: Emergency Medicine | Admitting: Emergency Medicine

## 2022-08-31 DIAGNOSIS — R300 Dysuria: Secondary | ICD-10-CM | POA: Diagnosis not present

## 2022-08-31 DIAGNOSIS — R109 Unspecified abdominal pain: Secondary | ICD-10-CM | POA: Diagnosis not present

## 2022-08-31 DIAGNOSIS — N3289 Other specified disorders of bladder: Secondary | ICD-10-CM | POA: Diagnosis not present

## 2022-08-31 DIAGNOSIS — Z7951 Long term (current) use of inhaled steroids: Secondary | ICD-10-CM | POA: Diagnosis not present

## 2022-08-31 DIAGNOSIS — N898 Other specified noninflammatory disorders of vagina: Secondary | ICD-10-CM | POA: Diagnosis not present

## 2022-08-31 DIAGNOSIS — N12 Tubulo-interstitial nephritis, not specified as acute or chronic: Secondary | ICD-10-CM | POA: Diagnosis not present

## 2022-08-31 DIAGNOSIS — J45909 Unspecified asthma, uncomplicated: Secondary | ICD-10-CM | POA: Diagnosis not present

## 2022-08-31 DIAGNOSIS — R3989 Other symptoms and signs involving the genitourinary system: Secondary | ICD-10-CM | POA: Diagnosis not present

## 2022-08-31 DIAGNOSIS — Z7251 High risk heterosexual behavior: Secondary | ICD-10-CM | POA: Diagnosis not present

## 2022-08-31 LAB — CBC
HCT: 41.1 % (ref 36.0–46.0)
Hemoglobin: 13.1 g/dL (ref 12.0–15.0)
MCH: 25.9 pg — ABNORMAL LOW (ref 26.0–34.0)
MCHC: 31.9 g/dL (ref 30.0–36.0)
MCV: 81.4 fL (ref 80.0–100.0)
Platelets: 403 10*3/uL — ABNORMAL HIGH (ref 150–400)
RBC: 5.05 MIL/uL (ref 3.87–5.11)
RDW: 13.9 % (ref 11.5–15.5)
WBC: 7.1 10*3/uL (ref 4.0–10.5)
nRBC: 0 % (ref 0.0–0.2)

## 2022-08-31 LAB — COMPREHENSIVE METABOLIC PANEL
ALT: 13 U/L (ref 0–44)
AST: 16 U/L (ref 15–41)
Albumin: 4.4 g/dL (ref 3.5–5.0)
Alkaline Phosphatase: 72 U/L (ref 38–126)
Anion gap: 7 (ref 5–15)
BUN: 11 mg/dL (ref 6–20)
CO2: 27 mmol/L (ref 22–32)
Calcium: 9.5 mg/dL (ref 8.9–10.3)
Chloride: 103 mmol/L (ref 98–111)
Creatinine, Ser: 1.02 mg/dL — ABNORMAL HIGH (ref 0.44–1.00)
GFR, Estimated: >60 mL/min (ref >60)
Glucose, Bld: 91 mg/dL (ref 70–99)
Potassium: 3.9 mmol/L (ref 3.5–5.1)
Sodium: 137 mmol/L (ref 135–145)
Total Bilirubin: 0.3 mg/dL (ref 0.3–1.2)
Total Protein: 8.3 g/dL — ABNORMAL HIGH (ref 6.5–8.1)

## 2022-08-31 LAB — URINALYSIS, ROUTINE W REFLEX MICROSCOPIC
Bacteria, UA: NONE SEEN
Bilirubin Urine: NEGATIVE
Glucose, UA: NEGATIVE mg/dL
Hgb urine dipstick: NEGATIVE
Ketones, ur: NEGATIVE mg/dL
Nitrite: NEGATIVE
Specific Gravity, Urine: 1.012 (ref 1.005–1.030)
WBC, UA: >50 WBC/hpf (ref 0–5)
pH: 8 (ref 5.0–8.0)

## 2022-08-31 LAB — LIPASE, BLOOD: Lipase: <10 U/L — ABNORMAL LOW (ref 11–51)

## 2022-08-31 LAB — PREGNANCY, URINE: Preg Test, Ur: NEGATIVE

## 2022-08-31 MED ORDER — ONDANSETRON HCL 4 MG PO TABS: 4.0000 mg | ORAL_TABLET | Freq: Four times a day (QID) | ORAL | 0 refills | Status: AC | PRN

## 2022-08-31 MED ORDER — IOHEXOL 300 MG/ML  SOLN
100.0000 mL | Freq: Once | INTRAMUSCULAR | Status: AC | PRN
  Administered 2022-08-31: 80 mL via INTRAVENOUS

## 2022-08-31 MED ORDER — KETOROLAC TROMETHAMINE 30 MG/ML IJ SOLN
30.0000 mg | Freq: Once | INTRAMUSCULAR | Status: AC
  Administered 2022-08-31: 30 mg via INTRAVENOUS
  Filled 2022-08-31: qty 1

## 2022-08-31 MED ORDER — KETOROLAC TROMETHAMINE 10 MG PO TABS: 10.0000 mg | ORAL_TABLET | Freq: Three times a day (TID) | ORAL | 0 refills | Status: AC | PRN

## 2022-08-31 MED ORDER — ONDANSETRON 4 MG PO TBDP
4.0000 mg | ORAL_TABLET | Freq: Once | ORAL | Status: AC
  Administered 2022-08-31: 4 mg via ORAL
  Filled 2022-08-31: qty 1

## 2022-08-31 MED ORDER — SODIUM CHLORIDE 0.9 % IV BOLUS
1000.0000 mL | Freq: Once | INTRAVENOUS | Status: AC
  Administered 2022-08-31: 1000 mL via INTRAVENOUS

## 2022-08-31 NOTE — ED Triage Notes (Addendum)
Pt presents to ED POV. Pt c/o RQ abd pain that began Monday. Also c/o n/v. Intermittent sharp pelvic pain. Pt reports that she was on menstrual cycle earlier this week that was late and shorter.

## 2022-08-31 NOTE — ED Provider Notes (Signed)
[facility] Provider Note   CSN: [id number] Arrival date & time: 08/31/22  1815     History  Chief Complaint  Patient presents with   Abdominal Pain    Anna Fitzpatrick is a 27 y.o. female with medical history of asthma, headaches, rectal polypectomy, patient presents to ED for evaluation of abdominal pain, right-sided flank pain.  Patient reports that on Sunday she developed right-sided abdominal pain and flank pain that also coincided with the start of her menstrual cycle.  The patient reports that her menstrual cycle lasted for 2 days which is abnormally short for her.  She states that her right-sided abdominal pain has persisted since the start of her menstrual cycle on Sunday.  She states that in the last 2 days the pain has focused in the right lower quadrant and right flank.  She reports that she has a burning sensation, a "pressure" at the end of her urine stream but denies any dysuria.  She is endorsing nausea without vomiting.  She is also endorsing a sensation as if she needs to have a bowel movement but is unable to do so.  She was seen at her OB/GYN's office where she had urinalysis conducted which showed leukocytes and she also had wet prep which showed clue cells.  She was given intravaginal metronidazole and sent home on ciprofloxacin for suspected UTI.  She had unremarkable GC/chlamydia screening at this time.  She denies fevers at home.  She denies vaginal discharge, dyspareunia.   Abdominal Pain Associated symptoms: dysuria and nausea   Associated symptoms: no chest pain, no constipation, no diarrhea, no fever, no shortness of breath, no vaginal discharge and no vomiting        Home Medications Prior to Admission medications   Medication Sig Start Date End Date Taking? Authorizing Provider  ketorolac (TORADOL) 10 MG tablet Take 1 tablet (10 mg total) by mouth every 8 (eight) hours as needed for severe pain. 08/31/22  Yes Al Decant, PA-C  ondansetron (ZOFRAN) 4 MG tablet Take 1 tablet (4 mg total) by mouth every 6 (six) hours as needed for nausea or vomiting. 08/31/22  Yes Al Decant, PA-C  albuterol (VENTOLIN HFA) 108 (90 Base) MCG/ACT inhaler Inhale 2 puffs into the lungs every 6 (six) hours as needed for wheezing or shortness of breath (Cough). 07/29/22   Theadora Rama Scales, PA-C  amitriptyline (ELAVIL) 10 MG tablet as directed. 02/13/19   [provider]  Azelastine-Fluticasone (DYMISTA) 137-50 MCG/ACT SUSP Place 1 spray into the nose every 12 (twelve) hours. 07/29/22 08/28/22  Theadora Rama Scales, PA-C  diclofenac (VOLTAREN) 75 MG EC tablet Take 1 tablet (75 mg total) by mouth 2 (two) times daily. 04/07/20   Mardella Layman, MD  fexofenadine (ALLEGRA) 180 MG tablet Take 1 tablet (180 mg total) by mouth daily. 07/29/22 01/25/23  Theadora Rama Scales, PA-C  gabapentin (NEURONTIN) 300 MG capsule Take by mouth. 03/31/20   [provider]  pantoprazole (PROTONIX) 40 MG tablet Take 1 tablet (40 mg total) by mouth daily. 07/29/22 10/27/22  Theadora Rama Scales, PA-C  Plecanatide (TRULANCE) 3 MG TABS Take 1 tablet (3 mg total) by mouth in the morning. 07/29/22 10/27/22  Theadora Rama Scales, PA-C  promethazine (PHENERGAN) 25 MG tablet Take 1 tablet (25 mg total) by mouth every 8 (eight) hours as needed for up to 7 days for nausea or vomiting. 07/29/22 08/05/22  Theadora Rama Scales, PA-C  Allergies    Zithromax [azithromycin] and Etonogestrel    Review of Systems   Review of Systems  Constitutional:  Negative for fever.  Respiratory:  Negative for shortness of breath.   Cardiovascular:  Negative for chest pain.  Gastrointestinal:  Positive for abdominal pain and nausea. Negative for constipation, diarrhea and vomiting.  Genitourinary:  Positive for dysuria and flank pain. Negative for dyspareunia, pelvic pain and vaginal discharge.  All other systems reviewed and are  negative.   Physical Exam Updated Vital Signs BP 113/66   Pulse 82   Temp 98.5 F (36.9 C) (Oral)   Resp 18   SpO2 100%  Physical Exam Vitals and nursing note reviewed.  Constitutional:      General: She is not in acute distress.    Appearance: Normal appearance. She is not ill-appearing, toxic-appearing or diaphoretic.  HENT:     Head: Normocephalic and atraumatic.     Nose: Nose normal.     Mouth/Throat:     Mouth: Mucous membranes are moist.     Pharynx: Oropharynx is clear.  Eyes:     Extraocular Movements: Extraocular movements intact.     Conjunctiva/sclera: Conjunctivae normal.     Pupils: Pupils are equal, round, and reactive to light.  Abdominal:     General: Abdomen is flat. Bowel sounds are normal.     Palpations: Abdomen is soft.     Tenderness: There is abdominal tenderness. There is right CVA tenderness. There is no guarding or rebound.     Comments: Tenderness to palpation of right lower quadrant, right flank.  No rebound or guarding.  No overlying skin change to abdomen.  Neurological:     Mental Status: She is alert.     ED Results / Procedures / Treatments   Labs (all labs ordered are listed, but only abnormal results are displayed) Labs Reviewed  LIPASE, BLOOD - Abnormal; Notable for the following components:      Result Value   Lipase <10 (*)    All other components within normal limits  COMPREHENSIVE METABOLIC PANEL - Abnormal; Notable for the following components:   Creatinine, Ser 1.02 (*)    Total Protein 8.3 (*)    All other components within normal limits  CBC - Abnormal; Notable for the following components:   MCH 25.9 (*)    Platelets 403 (*)    All other components within normal limits  URINALYSIS, ROUTINE W REFLEX MICROSCOPIC - Abnormal; Notable for the following components:   Protein, ur TRACE (*)    Leukocytes,Ua MODERATE (*)    Non Squamous Epithelial 0-5 (*)    All other components within normal limits  PREGNANCY, URINE     EKG None  Radiology CT ABDOMEN PELVIS W CONTRAST  Result Date: 08/31/2022 CLINICAL DATA:  Right-sided abdominal pain for several days, initial encounter EXAM: CT ABDOMEN AND PELVIS WITH CONTRAST TECHNIQUE: Multidetector CT imaging of the abdomen and pelvis was performed using the standard protocol following bolus administration of intravenous contrast. RADIATION DOSE REDUCTION: This exam was performed according to the departmental dose-optimization program which includes automated exposure control, adjustment of the mA and/or kV according to patient size and/or use of iterative reconstruction technique. CONTRAST:  80mL OMNIPAQUE IOHEXOL 300 MG/ML  SOLN COMPARISON:  None Available. FINDINGS: Lower chest: No acute abnormality. Hepatobiliary: No focal liver abnormality is seen. No gallstones, gallbladder wall thickening, or biliary dilatation. Pancreas: Unremarkable. No pancreatic ductal dilatation or surrounding inflammatory changes. Spleen: Normal in size without focal abnormality.  Adrenals/Urinary Tract: Adrenal glands are within normal limits. Kidneys demonstrate a normal enhancement pattern bilaterally. No renal calculi or obstructive changes are seen. The ureters are within normal limits. The bladder is partially distended. Stomach/Bowel: No obstructive or inflammatory changes of the colon are noted. The appendix is within normal limits. Small bowel is within normal limits. Stomach is unremarkable. Vascular/Lymphatic: No significant vascular findings are present. No enlarged abdominal or pelvic lymph nodes. Reproductive: Uterus and bilateral adnexa are unremarkable. Other: No abdominal wall hernia or abnormality. No abdominopelvic ascites. Musculoskeletal: No acute or significant osseous findings. IMPRESSION: No acute abnormality noted. Electronically Signed   By: Alcide Clever M.D.   On: 08/31/2022 21:24    Procedures Procedures   Medications Ordered in ED Medications  ondansetron (ZOFRAN-ODT)  disintegrating tablet 4 mg (4 mg Oral Given 08/31/22 1858)  ketorolac (TORADOL) 30 MG/ML injection 30 mg (30 mg Intravenous Given 08/31/22 2014)  sodium chloride 0.9 % bolus 1,000 mL (0 mLs Intravenous Stopped 08/31/22 2155)  iohexol (OMNIPAQUE) 300 MG/ML solution 100 mL (80 mLs Intravenous Contrast Given 08/31/22 2104)    ED Course/ Medical Decision Making/ A&P Clinical Course as of 08/31/22 2201  Thu Aug 31, 2022  1924 Intravaginal metronidazole for BV. Cipro for UTI. STI testing negative. [CG]    Clinical Course User Index [CG] Al Decant, PA-C   Medical Decision Making Amount and/or Complexity of Data Reviewed Labs: ordered. Radiology: ordered.  Risk Prescription drug management.   27 year old female presents to ED for evaluation.  Please see HPI for further details.  On examination the patient is afebrile and nontachycardic.  Her lung sounds are clear bilaterally and she is not hypoxic.  Her abdomen is soft and compressible throughout.  She does have tenderness of her right lower quadrant and right flank.  There is no rebound or guarding.  She has no overlying skin change to abdomen.  Does have corresponding right-sided CVA tenderness.  Patient CBC shows no leukocytosis or anemia.  Patient lipase is WNL.  Patient CMP shows an elevated creatinine 1.02, the patient was given 1 L of fluid for this.  She has no baseline creatinines to compare this to for the last 3 years.  The rest of her metabolic panel shows no elevated LFTs, anion gap 7, no electrolyte derangement.  Pregnancy test negative.  Her urinalysis shows leukocytes and protein, this is being cultured by her OB/GYN's office.  Because of focal tenderness patient had CT scan performed.  Patient CT abdomen pelvis shows no intra-abdominal pathology to account for patient pain.  No evidence of appendicitis, kidney stones.  Patient was given Toradol, Zofran for nausea and 1 L of fluid.  On reassessment, the patient reports  her pain is significantly decreased at this time.  She reports initially pain was 10 out of 10 but on reassessment pain is decreased to 2 out of 10.  Patient symptoms could be secondary to pyelonephritis.  She was started on ciprofloxacin at her OB/GYN's office and sent here to rule out appendicitis.  I advised the patient to follow back up with her OB/GYN office on Monday.  I advised her to continue taking ciprofloxacin which she was prescribed at OB/GYN office.  I will send her home with Toradol for pain and Zofran for nausea.  I have advised her that if she has any return of pain, increased the pain she will need to return to the ED for further management.  She voiced understanding.  She had all of her  questions answered to her satisfaction.  She was given strict return precautions prior to discharge.  She is stable to discharge home.   Final Clinical Impression(s) / ED Diagnoses Final diagnoses:  Pyelonephritis    Rx / DC Orders ED Discharge Orders          Ordered    ketorolac (TORADOL) 10 MG tablet  Every 8 hours PRN        08/31/22 2157    ondansetron (ZOFRAN) 4 MG tablet  Every 6 hours PRN        08/31/22 2157              Al Decant, PA-C 08/31/22 2201    Elayne Snare K, DO 08/31/22 2336

## 2022-08-31 NOTE — Discharge Instructions (Addendum)
It was a pleasure taking part in your care today.  As we discussed, I believe that you have a kidney infection because of your symptoms.  Your CT scan of your abdomen and your pelvis did not show any evidence of intra-abdominal pathology to account for your pain.  You were placed on antibiotics for kidney infection at OB/GYN's office.  Please begin taking these antibiotics as prescribed.  You may follow-up with your OB/GYN's office for reevaluation.  If you develop any new or worsening symptoms please return to the ED for further management.  You may take Toradol every 8 hours for pain.  You may take Zofran every 6 hours as needed for nausea and vomiting.  Please read the attached guide concerning pyelonephritis in your spare time.

## 2022-11-09 DIAGNOSIS — Z111 Encounter for screening for respiratory tuberculosis: Secondary | ICD-10-CM | POA: Diagnosis not present

## 2022-11-09 DIAGNOSIS — Z6830 Body mass index (BMI) 30.0-30.9, adult: Secondary | ICD-10-CM | POA: Diagnosis not present

## 2022-11-09 DIAGNOSIS — Z021 Encounter for pre-employment examination: Secondary | ICD-10-CM | POA: Diagnosis not present

## 2022-11-13 DIAGNOSIS — G43001 Migraine without aura, not intractable, with status migrainosus: Secondary | ICD-10-CM | POA: Diagnosis not present

## 2023-01-07 DIAGNOSIS — J018 Other acute sinusitis: Secondary | ICD-10-CM | POA: Diagnosis not present

## 2023-02-09 DIAGNOSIS — J329 Chronic sinusitis, unspecified: Secondary | ICD-10-CM | POA: Diagnosis not present

## 2023-02-09 DIAGNOSIS — F5104 Psychophysiologic insomnia: Secondary | ICD-10-CM | POA: Diagnosis not present

## 2023-02-14 DIAGNOSIS — M542 Cervicalgia: Secondary | ICD-10-CM | POA: Diagnosis not present

## 2023-02-14 DIAGNOSIS — G43001 Migraine without aura, not intractable, with status migrainosus: Secondary | ICD-10-CM | POA: Diagnosis not present

## 2023-03-19 DIAGNOSIS — G43001 Migraine without aura, not intractable, with status migrainosus: Secondary | ICD-10-CM | POA: Diagnosis not present

## 2023-03-19 DIAGNOSIS — J310 Chronic rhinitis: Secondary | ICD-10-CM | POA: Diagnosis not present

## 2023-03-19 DIAGNOSIS — G4700 Insomnia, unspecified: Secondary | ICD-10-CM | POA: Diagnosis not present

## 2023-03-19 DIAGNOSIS — N946 Dysmenorrhea, unspecified: Secondary | ICD-10-CM | POA: Diagnosis not present

## 2023-03-19 DIAGNOSIS — R0683 Snoring: Secondary | ICD-10-CM | POA: Diagnosis not present

## 2023-06-11 DIAGNOSIS — G4700 Insomnia, unspecified: Secondary | ICD-10-CM | POA: Diagnosis not present

## 2023-06-11 DIAGNOSIS — G43001 Migraine without aura, not intractable, with status migrainosus: Secondary | ICD-10-CM | POA: Diagnosis not present

## 2023-06-11 DIAGNOSIS — M542 Cervicalgia: Secondary | ICD-10-CM | POA: Diagnosis not present

## 2023-07-27 DIAGNOSIS — Z0000 Encounter for general adult medical examination without abnormal findings: Secondary | ICD-10-CM | POA: Diagnosis not present

## 2023-08-23 DIAGNOSIS — O99511 Diseases of the respiratory system complicating pregnancy, first trimester: Secondary | ICD-10-CM | POA: Diagnosis not present

## 2023-08-23 DIAGNOSIS — K5909 Other constipation: Secondary | ICD-10-CM | POA: Diagnosis not present

## 2023-08-23 DIAGNOSIS — J45909 Unspecified asthma, uncomplicated: Secondary | ICD-10-CM | POA: Diagnosis not present

## 2023-08-23 DIAGNOSIS — O209 Hemorrhage in early pregnancy, unspecified: Secondary | ICD-10-CM | POA: Diagnosis not present

## 2023-08-23 DIAGNOSIS — O23591 Infection of other part of genital tract in pregnancy, first trimester: Secondary | ICD-10-CM | POA: Diagnosis not present

## 2023-08-23 DIAGNOSIS — N760 Acute vaginitis: Secondary | ICD-10-CM | POA: Diagnosis not present

## 2023-08-23 DIAGNOSIS — N939 Abnormal uterine and vaginal bleeding, unspecified: Secondary | ICD-10-CM | POA: Diagnosis not present

## 2023-08-23 DIAGNOSIS — Z862 Personal history of diseases of the blood and blood-forming organs and certain disorders involving the immune mechanism: Secondary | ICD-10-CM | POA: Diagnosis not present

## 2023-08-23 DIAGNOSIS — R110 Nausea: Secondary | ICD-10-CM | POA: Diagnosis not present

## 2023-08-23 DIAGNOSIS — R109 Unspecified abdominal pain: Secondary | ICD-10-CM | POA: Diagnosis not present

## 2023-08-23 DIAGNOSIS — Z1331 Encounter for screening for depression: Secondary | ICD-10-CM | POA: Diagnosis not present

## 2023-08-23 DIAGNOSIS — Z3A12 12 weeks gestation of pregnancy: Secondary | ICD-10-CM | POA: Diagnosis not present

## 2023-08-27 DIAGNOSIS — N939 Abnormal uterine and vaginal bleeding, unspecified: Secondary | ICD-10-CM | POA: Diagnosis not present

## 2023-10-01 DIAGNOSIS — R110 Nausea: Secondary | ICD-10-CM | POA: Diagnosis not present

## 2023-10-01 DIAGNOSIS — K30 Functional dyspepsia: Secondary | ICD-10-CM | POA: Diagnosis not present

## 2023-11-08 DIAGNOSIS — N946 Dysmenorrhea, unspecified: Secondary | ICD-10-CM | POA: Diagnosis not present

## 2023-11-08 DIAGNOSIS — Z309 Encounter for contraceptive management, unspecified: Secondary | ICD-10-CM | POA: Diagnosis not present

## 2023-11-08 DIAGNOSIS — N939 Abnormal uterine and vaginal bleeding, unspecified: Secondary | ICD-10-CM | POA: Diagnosis not present

## 2023-11-20 DIAGNOSIS — G43001 Migraine without aura, not intractable, with status migrainosus: Secondary | ICD-10-CM | POA: Diagnosis not present

## 2023-11-20 DIAGNOSIS — N939 Abnormal uterine and vaginal bleeding, unspecified: Secondary | ICD-10-CM | POA: Diagnosis not present

## 2023-12-12 DIAGNOSIS — G43001 Migraine without aura, not intractable, with status migrainosus: Secondary | ICD-10-CM | POA: Diagnosis not present

## 2023-12-12 DIAGNOSIS — M542 Cervicalgia: Secondary | ICD-10-CM | POA: Diagnosis not present

## 2023-12-17 ENCOUNTER — Other Ambulatory Visit (HOSPITAL_COMMUNITY)
Admission: RE | Admit: 2023-12-17 | Discharge: 2023-12-17 | Disposition: A | Discharge status: Home / Self Care | Source: Ambulatory Visit | Attending: Obstetrics and Gynecology | Admitting: Obstetrics and Gynecology

## 2023-12-17 ENCOUNTER — Other Ambulatory Visit: Payer: Self-pay | Admitting: Obstetrics and Gynecology

## 2023-12-17 DIAGNOSIS — Z01419 Encounter for gynecological examination (general) (routine) without abnormal findings: Secondary | ICD-10-CM | POA: Diagnosis not present

## 2023-12-18 LAB — CYTOLOGY - PAP: Diagnosis: NEGATIVE

## 2024-01-14 DIAGNOSIS — N83201 Unspecified ovarian cyst, right side: Secondary | ICD-10-CM | POA: Diagnosis not present

## 2024-01-15 DIAGNOSIS — J019 Acute sinusitis, unspecified: Secondary | ICD-10-CM | POA: Diagnosis not present

## 2024-01-15 DIAGNOSIS — B9689 Other specified bacterial agents as the cause of diseases classified elsewhere: Secondary | ICD-10-CM | POA: Diagnosis not present

## 2024-01-15 NOTE — Progress Notes (Signed)
 Chief Complaint   Chief Complaint  Patient presents with   Cough    States cough x 1 day; illness started out as nasal congestion and sore throat; sore throat resolved; no fevers/ no chills    Subjective, HPI   Anna Fitzpatrick is a 28 y.o. presents today for:   History of Present Illness Anna Fitzpatrick is a 28 year old female with asthma who presents with worsening congestion and chest pain.  Upper respiratory symptoms - Congestion began 11 days ago, initially resembling a common cold. - Symptoms appeared to improve but then worsened, with severe congestion causing headaches. - No fever or chills during this period. - Currently using a nasal spray similar to Nasacort. - Has switched medications three times without relief.  Chest pain - Chest pain developed concurrently with worsening congestion. - Chest pain prompted inquiry regarding the need for an inhaler.  Cough - Cough began yesterday.  Asthma - History of asthma, typically dormant. - Does not regularly use an inhaler.  Allergies - Allergic to Ziprovax and Trulance .  There is no problem list on file for this patient.   Current Outpatient Medications on File Prior to Visit  Medication Sig Dispense Refill   IBUPROFEN (ADVIL MIGRAINE ORAL) Take by mouth     norethindrone (MICRONOR) 0.35 mg tablet Take 0.35 mg by mouth once daily     ondansetron  (ZOFRAN -ODT) 8 MG disintegrating tablet Take 8 mg by mouth every 8 (eight) hours as needed     AIMOVIG AUTOINJECTOR 70 mg/mL AtIn Inject 140 mg as directed monthly (Patient not taking: Reported on 01/15/2024)     baclofen (LIORESAL) 10 MG tablet Take 10 mg by mouth 2 (two) times daily as needed     DM/P-EPHED/ACETAMINOPH/DOXYLAM (ALKA-SELTZER PLUS COLD+FLU ORAL) Take by mouth. (Patient not taking: Reported on 08/23/2023)     ubrogepant 100 mg Tab Take 100 mg by mouth     No current facility-administered medications on file prior to visit.     Zithromax  [azithromycin]  No past medical history on file. Past Surgical History:  Procedure Laterality Date   Ear Tubes     juvenile polyp     Wisdon Teeth      Patient's problem list, past medical and social history, medications, and allergies were reviewed by me and updated in Epic.    ROS  See HPI.  Objective   Vitals:   01/15/24 1414 01/15/24 1416  BP:  122/80  Pulse:  77  Resp:  18  Temp:  36.8 C (98.3 F)  TempSrc:  Oral  SpO2:  98%  Weight: 87.5 kg (192 lb 14.4 oz)   Height: 156.2 cm (5' 1.5)   PainSc:  0-No pain   Vital signs and nursing note reviewed.  Patient's last menstrual period was 01/03/2024 (exact date). Body mass index is 35.86 kg/m.  Physical Exam Vitals reviewed.  Constitutional:      Appearance: Normal appearance.  HENT:     Head: Normocephalic and atraumatic.     Right Ear: Tympanic membrane, ear canal and external ear normal.     Left Ear: Tympanic membrane, ear canal and external ear normal.     Nose: Congestion and rhinorrhea present.     Mouth/Throat:     Mouth: Mucous membranes are moist.     Pharynx: Posterior oropharyngeal erythema present.     Comments: Postnasal drip of yellowish sputum Cardiovascular:     Rate and Rhythm: Normal rate and regular rhythm.  Heart sounds: Normal heart sounds. No murmur heard.    No friction rub. No gallop.  Pulmonary:     Effort: Pulmonary effort is normal.     Breath sounds: Normal breath sounds. No wheezing, rhonchi or rales.  Musculoskeletal:     Cervical back: Normal range of motion. No tenderness.  Lymphadenopathy:     Cervical: No cervical adenopathy.  Neurological:     Mental Status: She is alert.       Data    No results found for this visit on 01/15/24.    Results    Assessment & Plan   Requested Prescriptions   Signed Prescriptions Disp Refills   amoxicillin -clavulanate (AUGMENTIN) 875-125 mg tablet 20 tablet 0    Sig: Take 1 tablet (875 mg total) by mouth every 12  (twelve) hours for 10 days   benzonatate (TESSALON) 200 MG capsule 20 capsule 0    Sig: Take 1 capsule (200 mg total) by mouth 3 (three) times daily as needed for Cough for up to 7 days   albuterol  MDI, PROVENTIL , VENTOLIN , PROAIR , HFA 90 mcg/actuation inhaler 6.7 g 0    Sig: Inhale 2 inhalations into the lungs every 6 (six) hours as needed for Wheezing for up to 30 days      ICD-10-CM  1. Acute bacterial sinusitis  J01.90   B96.89    Assessment & Plan Acute bacterial sinusitis Symptoms persisted for 11 days with severe congestion and headaches. Nasacort nasal spray may delay healing. - Prescribed antibiotic. - Advised discontinuation of Nasacort nasal spray.  Asthma with acute exacerbation Acute exacerbation likely due to post-nasal drip from sinusitis. No regular inhaler use reported. - Prescribed albuterol  inhaler.     Assessment and plan discussed with the patient, they expressed understanding, and written After Visit Summary information given through MyChart portal and/or printed to patient.      Future Appointments   This patient does not currently have any appointments scheduled.    No follow-ups on file.   Cozette JONELLE Raddle, MD 01/15/2024   This note has been created using automated tools and reviewed for accuracy by dino, Cozette JONELLE Raddle, MD. Verbal consent to utilize Abridge software obtained.  This note was partially made with the aid of speech-to-text dictation, Abridge software and hand typing typographical errors are not intentional. There may be some errors related to pronouns, misinterruption of words or misspellings.  Please interpret note in context of as a way to document care provided today. Chart is reviewed prior to completion but errors may not always be identified or corrected during the review.
